# Patient Record
Sex: Female | Born: 1950 | Race: White | Hispanic: No | Smoking: Never smoker
Health system: Southern US, Community
[De-identification: ages and names within clinical notes are randomized; demographics above are authoritative.]

## PROBLEM LIST (undated history)

## (undated) DIAGNOSIS — G4730 Sleep apnea, unspecified: Secondary | ICD-10-CM

## (undated) DIAGNOSIS — I10 Essential (primary) hypertension: Secondary | ICD-10-CM

## (undated) DIAGNOSIS — E119 Type 2 diabetes mellitus without complications: Secondary | ICD-10-CM

## (undated) DIAGNOSIS — J449 Chronic obstructive pulmonary disease, unspecified: Secondary | ICD-10-CM

## (undated) DIAGNOSIS — B009 Herpesviral infection, unspecified: Secondary | ICD-10-CM

## (undated) HISTORY — PX: GALLBLADDER SURGERY: SHX652

## (undated) HISTORY — PX: HERNIA REPAIR: SHX51

## (undated) HISTORY — PX: BREAST EXCISIONAL BIOPSY: SUR124

## (undated) HISTORY — DX: Essential (primary) hypertension: I10

## (undated) HISTORY — PX: ABDOMINAL HYSTERECTOMY: SHX81

## (undated) HISTORY — DX: Herpesviral infection, unspecified: B00.9

## (undated) HISTORY — DX: Type 2 diabetes mellitus without complications: E11.9

---

## 2003-08-27 ENCOUNTER — Other Ambulatory Visit: Payer: Self-pay

## 2004-07-30 ENCOUNTER — Ambulatory Visit: Payer: Self-pay | Admitting: General Practice

## 2004-08-12 ENCOUNTER — Ambulatory Visit: Payer: Self-pay | Admitting: General Practice

## 2004-09-26 ENCOUNTER — Ambulatory Visit: Payer: Self-pay | Admitting: Surgery

## 2006-08-11 ENCOUNTER — Ambulatory Visit: Payer: Self-pay | Admitting: Endocrinology

## 2006-09-03 ENCOUNTER — Ambulatory Visit: Payer: Self-pay | Admitting: Internal Medicine

## 2006-10-05 ENCOUNTER — Ambulatory Visit: Payer: Self-pay | Admitting: Otolaryngology

## 2007-01-04 ENCOUNTER — Observation Stay: Payer: Self-pay | Admitting: Specialist

## 2007-01-04 ENCOUNTER — Other Ambulatory Visit: Payer: Self-pay

## 2007-07-15 ENCOUNTER — Ambulatory Visit: Payer: Self-pay | Admitting: Family Medicine

## 2008-02-28 ENCOUNTER — Ambulatory Visit: Payer: Self-pay | Admitting: Family Medicine

## 2008-10-29 ENCOUNTER — Ambulatory Visit: Payer: Self-pay | Admitting: Vascular Surgery

## 2009-10-03 ENCOUNTER — Ambulatory Visit: Payer: Self-pay | Admitting: Family Medicine

## 2010-10-29 ENCOUNTER — Ambulatory Visit: Payer: Self-pay | Admitting: Family Medicine

## 2011-11-02 ENCOUNTER — Ambulatory Visit: Payer: Self-pay | Admitting: Family Medicine

## 2012-01-08 ENCOUNTER — Ambulatory Visit: Payer: Self-pay | Admitting: Gastroenterology

## 2012-01-11 ENCOUNTER — Ambulatory Visit: Payer: Self-pay | Admitting: Specialist

## 2015-08-12 ENCOUNTER — Encounter: Payer: Self-pay | Admitting: *Deleted

## 2015-08-12 ENCOUNTER — Ambulatory Visit
Admission: RE | Admit: 2015-08-12 | Discharge: 2015-08-12 | Disposition: A | Discharge status: Home / Self Care | Payer: Self-pay | Source: Ambulatory Visit | Attending: Oncology | Admitting: Oncology

## 2015-08-12 ENCOUNTER — Ambulatory Visit: Payer: Self-pay | Attending: Oncology | Admitting: *Deleted

## 2015-08-12 VITALS — BP 131/75 | HR 60 | Temp 97.0°F | Ht 60.63 in | Wt 192.8 lb

## 2015-08-12 DIAGNOSIS — Z0000 Encounter for general adult medical examination without abnormal findings: Secondary | ICD-10-CM

## 2015-08-12 NOTE — Progress Notes (Signed)
Subjective:     Patient ID: Kimberly Bean, female   DOB: October 07, 1950, 65 y.o.   MRN: [id number]  HPI   Review of Systems     Objective:   Physical Exam  Pulmonary/Chest: Right breast exhibits no inverted nipple, no mass, no nipple discharge, no skin change and no tenderness. Left breast exhibits no inverted nipple, no mass, no nipple discharge, no skin change and no tenderness. Breasts are asymmetrical.  Right breast slightly larger than the left       Assessment:     65 year old White female referred to [facility] by Dr. Kary Kos for clinical breast exam and mammogram.  Clinical breast exam unremarkable.  Patient states she gets "a dot of white" on her nipples, which she then squeezes with some white discharge.  States this happens about once every 3-4 months for the last 10 years.  No wet discharge noted over the last 10 years or on todays exam. Taught self breast awareness.  Patient has been screened for eligibility.  She does not have any insurance, Medicare or Medicaid.  She also meets financial eligibility.  Hand-out given on the Affordable Care Act.     Plan:     Screening mammogram ordered.  Will follow up per [facility] protocol.  Patient is to call if she notices a wet nipple discharge or the amount, consistancy changes.  She is agreeable.

## 2015-08-12 NOTE — Patient Instructions (Signed)
Gave patient hand-out, Women Staying Healthy, Active and Well from [facility], with education on breast health, pap smears, heart and colon health. 

## 2015-08-23 ENCOUNTER — Encounter: Payer: Self-pay | Admitting: *Deleted

## 2015-08-23 NOTE — Progress Notes (Signed)
Letter mailed from the [facility] to inform patient of her normal mammogram results.  Patient is to follow-up with annual screening in one year.  HSIS to Christy. 

## 2016-08-28 ENCOUNTER — Other Ambulatory Visit: Payer: Self-pay | Admitting: Family Medicine

## 2016-08-28 DIAGNOSIS — Z1231 Encounter for screening mammogram for malignant neoplasm of breast: Secondary | ICD-10-CM

## 2016-09-16 ENCOUNTER — Ambulatory Visit
Admission: RE | Admit: 2016-09-16 | Discharge: 2016-09-16 | Disposition: A | Discharge status: Home / Self Care | Payer: Medicare HMO | Source: Ambulatory Visit | Attending: Family Medicine | Admitting: Family Medicine

## 2016-09-16 DIAGNOSIS — Z1231 Encounter for screening mammogram for malignant neoplasm of breast: Secondary | ICD-10-CM | POA: Diagnosis present

## 2017-08-18 ENCOUNTER — Other Ambulatory Visit: Payer: Self-pay | Admitting: Family Medicine

## 2017-08-18 DIAGNOSIS — Z1231 Encounter for screening mammogram for malignant neoplasm of breast: Secondary | ICD-10-CM

## 2017-09-17 ENCOUNTER — Ambulatory Visit
Admission: RE | Admit: 2017-09-17 | Discharge: 2017-09-17 | Disposition: A | Discharge status: Home / Self Care | Payer: Medicare HMO | Source: Ambulatory Visit | Attending: Family Medicine | Admitting: Family Medicine

## 2017-09-17 DIAGNOSIS — Z1231 Encounter for screening mammogram for malignant neoplasm of breast: Secondary | ICD-10-CM | POA: Diagnosis not present

## 2018-03-31 ENCOUNTER — Other Ambulatory Visit: Payer: Self-pay

## 2018-03-31 ENCOUNTER — Encounter: Payer: Self-pay | Admitting: Emergency Medicine

## 2018-03-31 ENCOUNTER — Emergency Department
Admission: EM | Admit: 2018-03-31 | Discharge: 2018-03-31 | Disposition: A | Discharge status: Home / Self Care | Payer: Medicare HMO | Attending: Emergency Medicine | Admitting: Emergency Medicine

## 2018-03-31 DIAGNOSIS — M545 Low back pain, unspecified: Secondary | ICD-10-CM

## 2018-03-31 MED ORDER — CYCLOBENZAPRINE HCL 5 MG PO TABS: 5.0000 mg | ORAL_TABLET | Freq: Three times a day (TID) | ORAL | 0 refills | Status: DC | PRN

## 2018-03-31 MED ORDER — KETOROLAC TROMETHAMINE 30 MG/ML IJ SOLN
30.0000 mg | Freq: Once | INTRAMUSCULAR | Status: AC
  Administered 2018-03-31: 30 mg via INTRAMUSCULAR
  Filled 2018-03-31: qty 1

## 2018-03-31 MED ORDER — LIDOCAINE 5 % EX PTCH
1.0000 | MEDICATED_PATCH | CUTANEOUS | Status: DC
  Administered 2018-03-31: 1 via TRANSDERMAL
  Filled 2018-03-31 (×2): qty 1

## 2018-03-31 MED ORDER — LIDOCAINE 5 % EX PTCH: 1.0000 | MEDICATED_PATCH | Freq: Two times a day (BID) | CUTANEOUS | 0 refills | Status: AC

## 2018-03-31 MED ORDER — CYCLOBENZAPRINE HCL 10 MG PO TABS
5.0000 mg | ORAL_TABLET | Freq: Once | ORAL | Status: AC
  Administered 2018-03-31: 5 mg via ORAL
  Filled 2018-03-31: qty 1

## 2018-03-31 MED ORDER — ACETAMINOPHEN 500 MG PO TABS
1000.0000 mg | ORAL_TABLET | Freq: Once | ORAL | Status: AC
  Administered 2018-03-31: 1000 mg via ORAL
  Filled 2018-03-31: qty 2

## 2018-03-31 NOTE — ED Notes (Signed)
Pt has lower back pain.    Pt reports twisting back 4 times during the past week.  Pt did not fall.   Pt denies urinary sx.  Pt alert.  Speech clear

## 2018-03-31 NOTE — ED Provider Notes (Signed)
[facility] Emergency Department Provider Note  ____________________________________________   First MD Initiated Contact with Patient 03/31/18 [phone]     (approximate)  I have reviewed the triage vital signs and the nursing notes.   HISTORY  Chief Complaint Back Pain    HPI Kimberly Bean is a 68 y.o. female with medical history as listed below who presents for evaluation of pain in her left lower back.  She states that she "twisted it" about a week ago when she was picking up something heavy in a store.  It has been bothering her intermittently but became worse tonight when she was trying to lift up her leg to climb into bed.  The pain is severe and she has trouble moving because moving around makes the pain worse.  If she bends over in a certain position the pain is relieved.  She denies any urinary symptoms including dysuria, difficulty urinating, urinary incontinence, and hematuria.  She has no history of kidney stones.  The pain is specifically on the left side of her lower back and is nonradiating.  She has no numbness or weakness in any of her extremities.  She had a similar issue a couple of years ago which eventually resolved on its own.  History reviewed. No pertinent past medical history.  There are no active problems to display for this patient.   Past Surgical History:  Procedure Laterality Date  . ABDOMINAL HYSTERECTOMY    . BREAST EXCISIONAL BIOPSY Right 2000s   neg    Prior to Admission medications   Medication Sig Start Date End Date Taking? Authorizing Provider  cyclobenzaprine (FLEXERIL) 5 MG tablet Take 1 tablet (5 mg total) by mouth 3 (three) times daily as needed for muscle spasms (back pain). 03/31/18   Hinda Kehr, MD  lidocaine (LIDODERM) 5 % Place 1 patch onto the skin every 12 (twelve) hours. Remove & Discard patch within 12 hours or as directed by MD.  Pers[place] Proud the patch off for 12 hours before applying a new one. 03/31/18 03/31/19   Hinda Kehr, MD    Allergies Penicillins  Family History  Problem Relation Age of Onset  . Breast cancer Neg Hx     Social History Social History   Tobacco Use  . Smoking status: Not on file  Substance Use Topics  . Alcohol use: Not on file  . Drug use: Not on file    Review of Systems Constitutional: No fever/chills Eyes: No visual changes. ENT: No sore throat. Cardiovascular: Denies chest pain. Respiratory: Denies shortness of breath. Gastrointestinal: No abdominal pain.  No nausea, no vomiting.  No diarrhea.  No constipation. Genitourinary: No urinary retention, incontinence, hematuria, nor dysuria. Musculoskeletal: Back pain as described above. Integumentary: Negative for rash. Neurological: Negative for headaches, focal weakness or numbness.   ____________________________________________   PHYSICAL EXAM:  VITAL SIGNS: ED Triage Vitals  Enc Vitals Group     BP 03/31/18 0030 (!) 143/81     Pulse Rate 03/31/18 0030 74     Resp 03/31/18 0030 16     Temp 03/31/18 0030 98 F (36.7 C)     Temp Source 03/31/18 0030 Oral     SpO2 03/31/18 0030 (!) 88 %     Weight 03/31/18 0022 87.5 kg (193 lb)     Height 03/31/18 0022 1.524 m (5')     Head Circumference --      Peak Flow --      Pain Score 03/31/18 0022 10  Pain Loc --      Pain Edu? --      Excl. in [place]? --     Constitutional: Alert and oriented.  Appears uncomfortable but nontoxic. Eyes: Conjunctivae are normal.  Head: Atraumatic. Cardiovascular: Normal rate, regular rhythm. Good peripheral circulation. Respiratory: Normal respiratory effort.   Musculoskeletal: Tenderness to palpation of the left side of her lower back including her flank.  Pain is reproduced with moving around but the patient cannot find certain positions of comfort.  No indurated or fluctuant lesions to suggest an infection. Neurologic:  Normal speech and language. No gross focal neurologic deficits are appreciated.  Skin:  Skin  is warm, dry and intact. No rash noted. Psychiatric: Mood and affect are normal. Speech and behavior are normal.  ____________________________________________   LABS (all labs ordered are listed, but only abnormal results are displayed)  Labs Reviewed - No data to display ____________________________________________  EKG  No indication for EKG ____________________________________________  RADIOLOGY   ED MD interpretation: No indication for imaging  Official radiology report(s): No results found.  ____________________________________________   PROCEDURES  Critical Care performed: No   Procedure(s) performed:   Procedures   ____________________________________________   INITIAL IMPRESSION / ASSESSMENT AND PLAN / ED COURSE  As part of my medical decision making, I reviewed the following data within the [place] notes reviewed and incorporated, Old chart reviewed, Notes from prior ED visits and [place] Controlled Substance Database    Presentation is most consistent with musculoskeletal back pain/strain.  I did discuss the possibility of kidney stones with the patient but given that she can find a position of comfort and has had no other urinary symptoms she is declining the CT scan at this time and I think that is appropriate.  Is much more likely to be a muscle strain.  Although she is 27 with multiple chronic medical conditions and I must consider the beers criteria, I think she would benefit from cyclobenzaprine, Lidoderm patch, Tylenol, and NSAIDs.  I reviewed her medication list and she is taking omeprazole which should be protective of her stomach when she takes the ibuprofen which I am recommending as an outpatient.  I am giving her Toradol 30 mg intramuscular here as well as cyclobenzaprine 5 mg by mouth and Tylenol 1000 mg by mouth.  I wrote prescriptions as described below and recommended that she follow-up with her primary care doctor at the  next available opportunity.  I gave my usual and customary return precautions.  She and her family understand and agree with the plan.     ____________________________________________  FINAL CLINICAL IMPRESSION(S) / ED DIAGNOSES  Final diagnoses:  Acute left-sided low back pain without sciatica     MEDICATIONS GIVEN DURING THIS VISIT:  Medications  lidocaine (LIDODERM) 5 % 1 patch (has no administration in time range)  acetaminophen (TYLENOL) tablet 1,000 mg (1,000 mg Oral Given 03/31/18 0146)  cyclobenzaprine (FLEXERIL) tablet 5 mg (5 mg Oral Given 03/31/18 0146)  ketorolac (TORADOL) 30 MG/ML injection 30 mg (30 mg Intramuscular Given 03/31/18 0147)     ED Discharge Orders         Ordered    lidocaine (LIDODERM) 5 %  Every 12 hours     03/31/18 0143    cyclobenzaprine (FLEXERIL) 5 MG tablet  3 times daily PRN     03/31/18 0143           Note:  This document was prepared using Dragon voice recognition software  and may include unintentional dictation errors.    Hinda Kehr, MD 03/31/18 [phone]

## 2018-03-31 NOTE — Discharge Instructions (Signed)
You have been seen in the Emergency Department (ED)  today for back pain.  Your workup and exam have not shown any acute abnormalities and you are likely suffering from muscle strain or possible problems with your discs, but there is no treatment that will fix your symptoms at this time.  Please take Motrin (ibuprofen) as needed for your pain according to the instructions written on the box.  Alternatively, for the next five days you can take 600mg  three times daily with meals (it may upset your stomach).  We also recommend you take Tylenol (acetaminophen) 650 mg by mouth every 6 hours as needed.  Use the prescribed muscle relaxer, but only if you are still having acute pain (do not take it any more than necessary).  Remember that it can make you sleepy, so try to only take it in the evening, and do not drive after taking the medication.  Use the prescribed Lidoderm patches according to the prescription instructions.  Please follow up with your doctor as soon as possible regarding today's ED visit and your back pain.  Return to the ED for worsening back pain, fever, weakness or numbness of either leg, or if you develop either (1) an inability to urinate or have bowel movements, or (2) loss of your ability to control your bathroom functions (if you start having "accidents"), or if you develop other new symptoms that concern you.

## 2018-03-31 NOTE — ED Triage Notes (Signed)
Pt to triage via w/c with no distress noted; reports last wk "twisted" her back; c/o persistent pain to left lower back, nonradiating with no accomp symptoms; st hx of same last yr

## 2018-08-29 ENCOUNTER — Other Ambulatory Visit: Payer: Self-pay | Admitting: Family Medicine

## 2018-08-29 DIAGNOSIS — Z1231 Encounter for screening mammogram for malignant neoplasm of breast: Secondary | ICD-10-CM

## 2018-10-06 ENCOUNTER — Ambulatory Visit
Admission: RE | Admit: 2018-10-06 | Discharge: 2018-10-06 | Disposition: A | Discharge status: Home / Self Care | Payer: Medicare HMO | Source: Ambulatory Visit | Attending: Family Medicine | Admitting: Family Medicine

## 2018-10-06 DIAGNOSIS — Z1231 Encounter for screening mammogram for malignant neoplasm of breast: Secondary | ICD-10-CM | POA: Diagnosis present

## 2019-03-08 ENCOUNTER — Ambulatory Visit: Payer: Medicare HMO | Attending: Internal Medicine

## 2019-03-08 DIAGNOSIS — Z20822 Contact with and (suspected) exposure to covid-19: Secondary | ICD-10-CM

## 2019-03-10 LAB — NOVEL CORONAVIRUS, NAA: SARS-CoV-2, NAA: NOT DETECTED

## 2019-03-13 ENCOUNTER — Telehealth: Payer: Self-pay | Admitting: General Practice

## 2019-03-13 NOTE — Telephone Encounter (Signed)
Negative COVID results given. Patient results "NOT Detected." Caller expressed understanding. ° °

## 2019-05-11 DIAGNOSIS — Z6841 Body Mass Index (BMI) 40.0 and over, adult: Secondary | ICD-10-CM | POA: Insufficient documentation

## 2019-09-12 ENCOUNTER — Other Ambulatory Visit: Payer: Self-pay | Admitting: Family Medicine

## 2019-09-12 DIAGNOSIS — Z1231 Encounter for screening mammogram for malignant neoplasm of breast: Secondary | ICD-10-CM

## 2019-10-09 ENCOUNTER — Other Ambulatory Visit: Payer: Self-pay

## 2019-10-09 ENCOUNTER — Ambulatory Visit
Admission: RE | Admit: 2019-10-09 | Discharge: 2019-10-09 | Disposition: A | Discharge status: Home / Self Care | Payer: Medicare HMO | Source: Ambulatory Visit | Attending: Family Medicine | Admitting: Family Medicine

## 2019-10-09 DIAGNOSIS — Z1231 Encounter for screening mammogram for malignant neoplasm of breast: Secondary | ICD-10-CM | POA: Insufficient documentation

## 2019-12-22 ENCOUNTER — Other Ambulatory Visit: Payer: Self-pay

## 2019-12-22 DIAGNOSIS — N9089 Other specified noninflammatory disorders of vulva and perineum: Secondary | ICD-10-CM

## 2019-12-22 NOTE — Progress Notes (Signed)
Received message from Kimberly Bean to refer for concern for vulvar cancer. Biopsied area 12/22/19. Referral entered. Called and spoke with Kimberly Bean. Appointment scheduled for 10/20 at 0830 with Kimberly Bean. Directions to the cancer center given.

## 2019-12-26 NOTE — Progress Notes (Signed)
Patient states she is having pain with lesions. Painful to sit down.

## 2019-12-27 ENCOUNTER — Other Ambulatory Visit: Payer: Self-pay

## 2019-12-27 ENCOUNTER — Inpatient Hospital Stay: Payer: Medicare HMO | Attending: Obstetrics and Gynecology | Admitting: Obstetrics and Gynecology

## 2019-12-27 DIAGNOSIS — I10 Essential (primary) hypertension: Secondary | ICD-10-CM | POA: Diagnosis not present

## 2019-12-27 DIAGNOSIS — Z6838 Body mass index (BMI) 38.0-38.9, adult: Secondary | ICD-10-CM | POA: Diagnosis not present

## 2019-12-27 DIAGNOSIS — D398 Neoplasm of uncertain behavior of other specified female genital organs: Secondary | ICD-10-CM

## 2019-12-27 DIAGNOSIS — G4710 Hypersomnia, unspecified: Secondary | ICD-10-CM | POA: Insufficient documentation

## 2019-12-27 DIAGNOSIS — E119 Type 2 diabetes mellitus without complications: Secondary | ICD-10-CM | POA: Diagnosis not present

## 2019-12-27 DIAGNOSIS — Z90710 Acquired absence of both cervix and uterus: Secondary | ICD-10-CM | POA: Insufficient documentation

## 2019-12-27 DIAGNOSIS — E669 Obesity, unspecified: Secondary | ICD-10-CM | POA: Insufficient documentation

## 2019-12-27 DIAGNOSIS — J439 Emphysema, unspecified: Secondary | ICD-10-CM

## 2019-12-27 DIAGNOSIS — R519 Headache, unspecified: Secondary | ICD-10-CM | POA: Insufficient documentation

## 2019-12-27 DIAGNOSIS — N9089 Other specified noninflammatory disorders of vulva and perineum: Secondary | ICD-10-CM

## 2019-12-27 DIAGNOSIS — I2510 Atherosclerotic heart disease of native coronary artery without angina pectoris: Secondary | ICD-10-CM | POA: Insufficient documentation

## 2019-12-27 DIAGNOSIS — K219 Gastro-esophageal reflux disease without esophagitis: Secondary | ICD-10-CM | POA: Insufficient documentation

## 2019-12-27 DIAGNOSIS — Z7982 Long term (current) use of aspirin: Secondary | ICD-10-CM | POA: Insufficient documentation

## 2019-12-27 DIAGNOSIS — C519 Malignant neoplasm of vulva, unspecified: Secondary | ICD-10-CM | POA: Insufficient documentation

## 2019-12-27 DIAGNOSIS — Z79899 Other long term (current) drug therapy: Secondary | ICD-10-CM | POA: Diagnosis not present

## 2019-12-27 DIAGNOSIS — Z7984 Long term (current) use of oral hypoglycemic drugs: Secondary | ICD-10-CM | POA: Diagnosis not present

## 2019-12-27 DIAGNOSIS — E785 Hyperlipidemia, unspecified: Secondary | ICD-10-CM | POA: Insufficient documentation

## 2019-12-27 DIAGNOSIS — M629 Disorder of muscle, unspecified: Secondary | ICD-10-CM | POA: Insufficient documentation

## 2019-12-27 DIAGNOSIS — R51 Headache: Secondary | ICD-10-CM | POA: Insufficient documentation

## 2019-12-27 MED ORDER — HYDROCODONE-ACETAMINOPHEN 5-325 MG PO TABS: 1.0000 | ORAL_TABLET | Freq: Three times a day (TID) | ORAL | 0 refills | Status: DC | PRN

## 2019-12-27 NOTE — Patient Instructions (Signed)
You have an appointment scheduled with Dr. Mellody Drown at the [facility], Friday, October 22nd, at 8:30 am. The address is [place]. [place]. Clinic [facility].

## 2019-12-27 NOTE — Progress Notes (Signed)
Gynecologic Oncology Consult Visit   Referring Provider: Dr Leonides Schanz  Chief Concern: vulvar lesion  Subjective:  Kimberly Bean is a 69 y.o. P3 female who is seen in consultation from Dr. Leonides Schanz for vulvar lesion.  Seen by Dr Leonides Schanz 12/22/19 in consultation from Dr Kary Kos.  She reports a vulvar lesion that has been ongoing since April this year. She has tried multiple things to help get rid of the lesion but nothing has worked. Intermittently has bleeding from the site.  Dr Leonides Schanz reports a clitoral mass that is about 2cm x 2cm, ulcerated and white plaques present. Mass noted behind clitoral lesion extending internally and towards the right labia by approximately 2cm.  Dr Leonides Schanz did periclitoral biopsy and results still pending,  Using non narcotic pain medication.  Still has a lot of pain.  Patient has had a hysterectomy. 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 Diabetes mellitus without complication ([place])   . Hypertension     Past Surgical History: Past Surgical History:  Procedure Laterality Date  . ABDOMINAL HYSTERECTOMY    . BREAST EXCISIONAL BIOPSY Right 2000s   neg  . GALLBLADDER SURGERY    . HERNIA REPAIR     Immunization History  Administered Date(s) Administered  . PFIZER SARS-COV-2 Vaccination 04/14/2019, 04/24/2019, 05/15/2019, 05/15/2019  . Pneumococcal Conjugate-13 10/26/2017, 11/07/2017  . Pneumococcal Polysaccharide-23 11/08/2018  . Zoster Recombinat (Shingrix) 10/26/2017, 11/07/2017, 12/27/2017     Family History: Family History  Problem Relation Age of Onset  . Breast cancer Neg Hx     Social History: She is divorced. Currently retired.  Social History   Socioeconomic History  . Marital status: Married    Spouse name: Not on file  . Number of children: Not on file  . Years of education: Not on file  . Highest  education level: Not on file  Occupational History  . Not on file  Tobacco Use  . Smoking status: Never Smoker  . Smokeless tobacco: Never Used  Substance and Sexual Activity  . Alcohol use: Not Currently  . Drug use: Never  . Sexual activity: Not on file  Other Topics Concern  . Not on file  Social History Narrative  . Not on file   Social Determinants of Health   Financial Resource Strain:   . Difficulty of Paying Living Expenses: Not on file  Food Insecurity:   . Worried About Charity fundraiser in the Last Year: Not on file  . Ran Out of Food in the Last Year: Not on file  Transportation Needs:   . Lack of Transportation (Medical): Not on file  . Lack of Transportation (Non-Medical): Not on file  Physical Activity:   . Days of Exercise per Week: Not on file  . Minutes of Exercise per Session: Not on file  Stress:   . Feeling of Stress : Not on file  Social Connections:   . Frequency of Communication with Friends and Family: Not on file  . Frequency of Social Gatherings with Friends and Family: Not on file  . Attends Religious Services: Not on file  . Active Member of Clubs or Organizations: Not on file  . Attends Archivist Meetings: Not on file  . Marital Status: Not on file  Intimate Partner Violence:   . Fear of Current or Ex-Partner:  Not on file  . Emotionally Abused: Not on file  . Physically Abused: Not on file  . Sexually Abused: Not on file    Allergies: Allergies  Allergen Reactions  . Cephalexin Other (See Comments)  . Penicillins Rash    Current Medications: Current Outpatient Medications  Medication Sig Dispense Refill  . ADVAIR DISKUS 250-50 MCG/DOSE AEPB SMARTSIG:1 Puff(s) Via Inhaler Every 12 Hours    . alendronate (FOSAMAX) 70 MG tablet TAKE 1 TABLET BY MOUTH EVERY 7 DAYS WITH A FULL GLASS OF WATER. DO NOT LIE DOWN FOR THE NEXT 30 MINUTES    . aspirin 325 MG tablet Take by mouth.    . Calcium Carbonate-Vitamin D3 600-400 MG-UNIT  TABS Take by mouth.    . Fluticasone-Salmeterol (ADVAIR) 250-50 MCG/DOSE AEPB Inhale into the lungs.    Marland Kitchen gemfibrozil (LOPID) 600 MG tablet Take 600 mg by mouth 2 (two) times daily.    . metFORMIN (GLUCOPHAGE) 1000 MG tablet Take 1,000 mg by mouth 2 (two) times daily.    . metoprolol tartrate (LOPRESSOR) 25 MG tablet Take 1 tablet by mouth 2 (two) times daily.    . nateglinide (STARLIX) 120 MG tablet Take 120 mg by mouth 3 (three) times daily.    Marland Kitchen omega-3 fish oil (MAXEPA) 1000 MG CAPS capsule Take by mouth.    . pravastatin (PRAVACHOL) 20 MG tablet Take 20 mg by mouth daily.    Marland Kitchen rOPINIRole (REQUIP) 0.25 MG tablet TAKE 1 TABLET BY MOUTH EVERY NIGHT AT NIGHT, MAY TITRATE BY 1 TABLET EVERY 2 TO 3 DAYS AS NEEDED.    . ciprofloxacin (CIPRO) 250 MG tablet Take 250 mg by mouth 2 (two) times daily. (Patient not taking: Reported on 12/26/2019)    . cyclobenzaprine (FLEXERIL) 5 MG tablet Take 1 tablet (5 mg total) by mouth 3 (three) times daily as needed for muscle spasms (back pain). (Patient not taking: Reported on 12/26/2019) 30 tablet 0  . HYDROcodone-acetaminophen (NORCO) 5-325 MG tablet Take 1 tablet by mouth every 8 (eight) hours as needed for moderate pain or severe pain. 30 tablet 0  . omeprazole (PRILOSEC) 20 MG capsule Take 20 mg by mouth daily. (Patient not taking: Reported on 12/26/2019)     No current facility-administered medications for this visit.    Review of Systems General: negative for, fevers, chills, fatigue, changes in sleep, changes in weight or appetite Skin: negative for changes in color, texture, moles or lesions Eyes: negative for, changes in vision, pain, diplopia HEENT: some tinnitus and hearing loss Breasts: negative for breast lumps Pulmonary: mild dyspnea on exertion Cardiac: negative for, palpitations, syncope, pain, discomfort, pressure Gastrointestinal: negative for, dysphagia, nausea, vomiting, jaundice, pain, constipation, diarrhea, hematemesis,  hematochezia Musculoskeletal: negative for, pain, stiffness, swelling, range of motion limitation Hematology: negative for, easy bruising, bleeding Neurologic/Psych: negative for, headaches, seizures, paralysis, weakness, tremor, change in gait, change in sensation, mood swings, depression, anxiety, change in memory  Objective:  Physical Examination:  BP (!) 139/55   Pulse 60   Temp 98 F (36.7 C)   Resp 20   Wt 196 lb 14.4 oz (89.3 kg)   LMP 08/12/2003 (Approximate)   SpO2 90%   BMI 38.45 kg/m    ECOG Performance Status: 1 - Symptomatic but completely ambulatory  General appearance: alert, cooperative and appears stated age HEENT:PERRLA and extra ocular movement intact Lymph node survey: non-palpable, axillary, inguinal, supraclavicular Cardiovascular: regular rate and rhythm, no murmurs or gallops Respiratory: normal air entry, lungs clear to auscultation  and no rales, rhonchi or wheezing Breast exam: not examined. Abdomen: soft, non-tender, without masses or organomegaly, no hernias and well healed incision Back: inspection of back is normal Extremities: extremities normal, atraumatic, no cyanosis or edema Skin exam - normal coloration and turgor, no rashes, no suspicious skin lesions noted. Neurological exam reveals alert, oriented, normal speech, no focal findings or movement disorder noted.  Pelvic: exam chaperoned by nurse;  Vulva: normal appearing vulva with no masses, tenderness or lesions 4 cm fungating lesion from 9-12 o'clock that involves the clitoris ; Vagina: normal vagina; Adnexa: normal adnexa in size, nontender and no masses.  Rectal normal.  Lab Review  none    Assessment:  Kimberly Bean is a 69 y.o. 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 Biopsy from 10/15 pending.    Medical co-morbidities complicating care: HTN, diabetes and obesity.  Plan:   Problem List Items Addressed This Visit      Respiratory    Emphysema of lung ([place])   Relevant Medications   ADVAIR DISKUS 250-50 MCG/DOSE AEPB   Fluticasone-Salmeterol (ADVAIR) 250-50 MCG/DOSE AEPB     Genitourinary   Vulvar cancer (H[facility])   Relevant Medications   ciprofloxacin (CIPRO) 250 MG tablet   aspirin 325 MG tablet      We discussed options for management including radical vulvectomy and bilateral inguinofemoral lymph node dissection.  The lesion is too large for a sentinel node approach, although we may inject methylene blue to aid in the dissection.  Will give her oxycodone 5 mg q6h prn for pain and also lidocaine gel.    We will see her in my [place] clinic later this week for pre op visit.   The patient's diagnosis, an outline of the further diagnostic and laboratory studies which will be required, the recommendation, and alternatives were discussed.  All questions were answered to the patient's satisfaction.  A total of 60 minutes were spent with the patient/family today; 60% was spent in education, counseling and coordination of care for vulvar cancer.    Verlon Au, NP   CC:  [place] Pink, MD [address] [place],  [place] [address] [phone]

## 2020-01-01 LAB — IGP, APTIMA HPV: HPV Aptima: NEGATIVE

## 2020-01-02 ENCOUNTER — Telehealth: Payer: Self-pay

## 2020-01-02 NOTE — Telephone Encounter (Signed)
Called and notified Kimberly Bean with negative pap smear/hpv results.

## 2020-01-03 ENCOUNTER — Ambulatory Visit: Payer: Medicare HMO

## 2020-01-19 ENCOUNTER — Telehealth: Payer: Self-pay

## 2020-01-19 NOTE — Telephone Encounter (Signed)
Called and spoke with Kimberly Bean. Post operative wound check arranged for 01/31/20 at 1130 with Dr. Fransisca Connors. Pathology pending at this time.

## 2020-01-31 ENCOUNTER — Encounter (INDEPENDENT_AMBULATORY_CARE_PROVIDER_SITE_OTHER): Payer: Self-pay

## 2020-01-31 ENCOUNTER — Other Ambulatory Visit: Payer: Self-pay

## 2020-01-31 ENCOUNTER — Inpatient Hospital Stay: Payer: Medicare HMO | Attending: Obstetrics and Gynecology | Admitting: Nurse Practitioner

## 2020-01-31 VITALS — BP 137/61 | HR 76 | Temp 97.8°F | Resp 20 | Wt 190.2 lb

## 2020-01-31 DIAGNOSIS — Z9079 Acquired absence of other genital organ(s): Secondary | ICD-10-CM | POA: Diagnosis not present

## 2020-01-31 DIAGNOSIS — C519 Malignant neoplasm of vulva, unspecified: Secondary | ICD-10-CM

## 2020-01-31 NOTE — Progress Notes (Signed)
Gynecologic Oncology Interval Visit   Referring Provider: Dr Leonides Schanz  Chief Concern: vulvar lesion  Subjective:  Kimberly Bean is a 69 y.o. 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   She underwent radical vulvectomy on 01/15/20 with Dr. Fransisca Connors. Final path: at least 1B scc of vulva, tumor size 2.9 cm, DOI 11 mm, negative margin, closest margin 6 mm.   Case discussed at [facility] and surveillance was recommended. She continues lovenox. Some pain and bruising at injection site but otherwise is feeling well. Requesting pain medication.    Gynecologic Oncology History:  Seen by Dr Leonides Schanz 12/22/19 in consultation from Dr Kary Kos.  She reports a vulvar lesion that has been ongoing since April this year. She has tried multiple things to help get rid of the lesion but nothing has worked. Intermittently has bleeding from the site.  Dr Leonides Schanz reports a clitoral mass that is about 2cm x 2cm, ulcerated and white plaques present. Mass noted behind clitoral lesion extending internally and towards the right labia by approximately 2cm.  Dr Leonides Schanz did periclitoral biopsy and results still pending,  Using non narcotic pain medication.  Still has a lot of pain.  Patient has had a hysterectomy. 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 Diabetes mellitus without complication ([place])   . Herpes    Last outbreak 1980  . Hypertension     Past Surgical History: Past Surgical History:  Procedure Laterality Date  . ABDOMINAL HYSTERECTOMY    . BREAST EXCISIONAL BIOPSY Right 2000s   neg  . GALLBLADDER SURGERY    . HERNIA REPAIR     Immunization History  Administered Date(s) Administered  . PFIZER SARS-COV-2 Vaccination 04/14/2019,  04/24/2019, 05/15/2019, 05/15/2019  . Pneumococcal Conjugate-13 10/26/2017, 11/07/2017  . Pneumococcal Polysaccharide-23 11/08/2018  . Zoster Recombinat (Shingrix) 10/26/2017, 11/07/2017, 12/27/2017     Family History: Family History  Problem Relation Age of Onset  . Breast cancer Neg Hx     Social History: She is divorced. Currently retired.  Social History   Socioeconomic History  . Marital status: Married    Spouse name: Not on file  . Number of children: Not on file  . Years of education: Not on file  . Highest education level: Not on file  Occupational History  . Not on file  Tobacco Use  . Smoking status: Never Smoker  . Smokeless tobacco: Never Used  Substance and Sexual Activity  . Alcohol use: Not Currently  . Drug use: Never  . Sexual activity: Not on file  Other Topics Concern  . Not on file  Social History Narrative  . Not on file   Social Determinants of Health   Financial Resource Strain:   . Difficulty of Paying Living Expenses: Not on file  Food Insecurity:   . Worried About Charity fundraiser in the Last Year: Not on file  . Ran Out of Food in the Last Year: Not on file  Transportation Needs:   . Lack of Transportation (Medical): Not on file  . Lack of Transportation (Non-Medical): Not on file  Physical Activity:   . Days of Exercise per Week: Not on file  . Minutes of Exercise per Session: Not on  file  Stress:   . Feeling of Stress : Not on file  Social Connections:   . Frequency of Communication with Friends and Family: Not on file  . Frequency of Social Gatherings with Friends and Family: Not on file  . Attends Religious Services: Not on file  . Active Member of Clubs or Organizations: Not on file  . Attends Archivist Meetings: Not on file  . Marital Status: Not on file  Intimate Partner Violence:   . Fear of Current or Ex-Partner: Not on file  . Emotionally Abused: Not on file  . Physically Abused: Not on file  . Sexually  Abused: Not on file   Immunization History  Administered Date(s) Administered  . PFIZER SARS-COV-2 Vaccination 04/14/2019, 04/24/2019, 05/15/2019, 05/15/2019  . Pneumococcal Conjugate-13 10/26/2017, 11/07/2017  . Pneumococcal Polysaccharide-23 11/08/2018  . Zoster Recombinat (Shingrix) 10/26/2017, 11/07/2017, 12/27/2017    Allergies: Allergies  Allergen Reactions  . Cephalexin Other (See Comments)  . Penicillins Rash    Current Medications: Current Outpatient Medications  Medication Sig Dispense Refill  . ADVAIR DISKUS 250-50 MCG/DOSE AEPB SMARTSIG:1 Puff(s) Via Inhaler Every 12 Hours    . alendronate (FOSAMAX) 70 MG tablet TAKE 1 TABLET BY MOUTH EVERY 7 DAYS WITH A FULL GLASS OF WATER. DO NOT LIE DOWN FOR THE NEXT 30 MINUTES    . Calcium Carbonate-Vitamin D3 600-400 MG-UNIT TABS Take by mouth.    . enoxaparin (LOVENOX) 40 MG/0.4ML injection Inject into the skin.    Marland Kitchen Fluticasone-Salmeterol (ADVAIR) 250-50 MCG/DOSE AEPB Inhale into the lungs.    Marland Kitchen gemfibrozil (LOPID) 600 MG tablet Take 600 mg by mouth 2 (two) times daily.    Marland Kitchen HYDROcodone-acetaminophen (NORCO) 5-325 MG tablet Take 1 tablet by mouth every 8 (eight) hours as needed for moderate pain or severe pain. 30 tablet 0  . metFORMIN (GLUCOPHAGE) 1000 MG tablet Take 1,000 mg by mouth 2 (two) times daily.    . metoprolol tartrate (LOPRESSOR) 25 MG tablet Take 1 tablet by mouth 2 (two) times daily.    . nateglinide (STARLIX) 120 MG tablet Take 120 mg by mouth 3 (three) times daily.    Marland Kitchen omega-3 fish oil (MAXEPA) 1000 MG CAPS capsule Take by mouth.    Marland Kitchen omeprazole (PRILOSEC) 20 MG capsule Take 20 mg by mouth daily.     . pravastatin (PRAVACHOL) 20 MG tablet Take 20 mg by mouth daily.    Marland Kitchen rOPINIRole (REQUIP) 0.25 MG tablet TAKE 1 TABLET BY MOUTH EVERY NIGHT AT NIGHT, MAY TITRATE BY 1 TABLET EVERY 2 TO 3 DAYS AS NEEDED.    Marland Kitchen aspirin 325 MG tablet Take by mouth. (Patient not taking: Reported on 01/30/2020)    . ciprofloxacin (CIPRO)  250 MG tablet Take 250 mg by mouth 2 (two) times daily. (Patient not taking: Reported on 12/26/2019)    . cyclobenzaprine (FLEXERIL) 5 MG tablet Take 1 tablet (5 mg total) by mouth 3 (three) times daily as needed for muscle spasms (back pain). (Patient not taking: Reported on 12/26/2019) 30 tablet 0   No current facility-administered medications for this visit.    Review of Systems General:  no complaints Skin: no complaints Eyes: no complaints HEENT: no complaints Breasts: no complaints Pulmonary: no complaints Cardiac: no complaints Gastrointestinal: no complaints Genitourinary/Sexual: no complaints Ob/Gyn: per hpi Musculoskeletal: no complaints Hematology: no complaints Neurologic/Psych: no complaints   Objective:  Physical Examination:  BP 137/61   Pulse 76   Temp 97.8 F (36.6 C)   Resp  20   Wt 190 lb 3.2 oz (86.3 kg)   LMP 08/12/2003 (Approximate)   SpO2 (!) 88%   BMI 37.15 kg/m    ECOG Performance Status: 1 - Symptomatic but completely ambulatory  GENERAL: Patient is a well appearing female in no acute distress HEENT:  Sclera clear. Anicteric NODES:  Negative axillary, supraclavicular, inguinal lymph node survery LUNGS:  Clear to auscultation bilaterally.   HEART:  Regular rate and rhythm.  ABDOMEN:  Soft, nontender.  No hernias, incisions well healed. No masses or ascites EXTREMITIES:  No peripheral edema. Atraumatic. No cyanosis SKIN:  Clear with no obvious rashes or skin changes.  NEURO:  Nonfocal. Well oriented.  Appropriate affect.  Pelvic: External genitalia: surgical incision healing well. Suture material still present. No evidence of infection. Speculum, Internal, and Rectal exam: deferred.   Lab Review  none    Assessment:  JANEKA LIBMAN is a 69 y.o. female diagnosed with presumed at least stage IB vulvar cancer. Biopsy on 10/15 not diagnostic but suspicious for squamous cell carcinoma. Underwent radical vulvectomy with Dr. Fransisca Connors on 01/15/20.  Inguinal node dissection deferred due to inguinal candidal infection discovered at time of surgery. Final pathology with at least IB squamous cell carcinoma of the vulva. Tumor size 2.9 cm, depth of invasion 11 mm. Negative margin, closest margin 6 mm. Presents for wound check. Healing well suture material present. Continues lovenox.    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 Plan is for her to return to surgery at [facility] in next 1-2 weeks. Calton Dach, PA updated. Post op wound healing slowly but well. No evidence of secondary infection. Recommended she continue using nystatin, keep inguinal folds and groin clean and dry to present candida. Continue lovenox as prescribed. Sent prescription for Tylenol #3 for pain. Opioid precautions reviewed.   The patient's diagnosis, an outline of the further diagnostic and laboratory studies which will be required, the recommendation, and alternatives were discussed.  All questions were answered to the patient's satisfaction.   Verlon Au, NP   CC:  [place] Pink, MD [address] [place],  [place] [address] [phone]

## 2020-02-05 ENCOUNTER — Telehealth: Payer: Self-pay | Admitting: *Deleted

## 2020-02-05 MED ORDER — ACETAMINOPHEN-CODEINE #3 300-30 MG PO TABS
1.0000 | ORAL_TABLET | Freq: Four times a day (QID) | ORAL | 0 refills | Status: DC | PRN
Start: 2020-02-05 — End: 2021-09-17

## 2020-02-05 NOTE — Telephone Encounter (Signed)
sent 

## 2020-02-05 NOTE — Telephone Encounter (Signed)
Patient called reporting that she was to have had a pain medicine called in on the 24th when seen and it was not sent. She requests that this be done

## 2020-02-16 DIAGNOSIS — I739 Peripheral vascular disease, unspecified: Secondary | ICD-10-CM | POA: Insufficient documentation

## 2020-02-16 DIAGNOSIS — G2581 Restless legs syndrome: Secondary | ICD-10-CM | POA: Insufficient documentation

## 2020-06-12 ENCOUNTER — Other Ambulatory Visit: Payer: Self-pay

## 2020-06-12 ENCOUNTER — Inpatient Hospital Stay: Payer: Medicare HMO | Attending: Obstetrics and Gynecology | Admitting: Obstetrics and Gynecology

## 2020-06-12 VITALS — BP 160/73 | HR 68 | Temp 98.2°F | Resp 16 | Wt 191.5 lb

## 2020-06-12 DIAGNOSIS — Z7901 Long term (current) use of anticoagulants: Secondary | ICD-10-CM | POA: Insufficient documentation

## 2020-06-12 DIAGNOSIS — Z7982 Long term (current) use of aspirin: Secondary | ICD-10-CM | POA: Diagnosis not present

## 2020-06-12 DIAGNOSIS — I10 Essential (primary) hypertension: Secondary | ICD-10-CM | POA: Diagnosis not present

## 2020-06-12 DIAGNOSIS — C519 Malignant neoplasm of vulva, unspecified: Secondary | ICD-10-CM | POA: Diagnosis present

## 2020-06-12 DIAGNOSIS — E119 Type 2 diabetes mellitus without complications: Secondary | ICD-10-CM | POA: Insufficient documentation

## 2020-06-12 DIAGNOSIS — Z7984 Long term (current) use of oral hypoglycemic drugs: Secondary | ICD-10-CM | POA: Insufficient documentation

## 2020-06-12 DIAGNOSIS — Z79899 Other long term (current) drug therapy: Secondary | ICD-10-CM | POA: Insufficient documentation

## 2020-06-12 DIAGNOSIS — I252 Old myocardial infarction: Secondary | ICD-10-CM | POA: Diagnosis not present

## 2020-06-12 DIAGNOSIS — Z9079 Acquired absence of other genital organ(s): Secondary | ICD-10-CM | POA: Insufficient documentation

## 2020-06-12 DIAGNOSIS — E669 Obesity, unspecified: Secondary | ICD-10-CM | POA: Diagnosis not present

## 2020-06-12 NOTE — Progress Notes (Signed)
Gynecologic Oncology Interval Visit   Referring Provider: Dr Leonides Schanz  Chief Concern: vulvar cancer  Subjective:  Kimberly Bean is a 70 y.o. 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    No new complaints today.   Gynecologic Oncology History:  Seen by Dr Leonides Schanz 12/22/19 in consultation from Dr Kary Kos.  She reports a vulvar lesion that has been ongoing since April this year. She has tried multiple things to help get rid of the lesion but nothing has worked. Intermittently has bleeding from the site.  Dr Leonides Schanz reports a clitoral mass that is about 2cm x 2cm, ulcerated and white plaques present. Mass noted behind clitoral lesion extending internally and towards the right labia by approximately 2cm.  Dr Leonides Schanz did periclitoral biopsy and results still pending,  Patient has had a hysterectomy. 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 Fransisca Connors. Final path: at least 1B scc of vulva, tumor size 2.9 cm, DOI 11 mm, negative margin, closest margin 6 mm. Inguinal lymph nodes not removed due to severe yeast infection.   On 02/20/20 she underwent bilateral inguinal SLN mapping and biopsies with three negative nodes on the right and one negative node on the left.   Case discussed at [facility] and surveillance was recommended. She completed course of lovenox.    Past Medical History: Past Medical History:  Diagnosis Date  . Diabetes mellitus without complication ([place])   . Herpes    Last outbreak 1980  . Hypertension     Past Surgical History: Past Surgical History:  Procedure Laterality Date  . ABDOMINAL HYSTERECTOMY    . BREAST EXCISIONAL BIOPSY Right 2000s   neg  . GALLBLADDER SURGERY    . HERNIA REPAIR     Immunization History  Administered Date(s)  Administered  . PFIZER(Purple Top)SARS-COV-2 Vaccination 04/14/2019, 04/24/2019, 05/15/2019, 05/15/2019  . Pneumococcal Conjugate-13 10/26/2017, 11/07/2017  . Pneumococcal Polysaccharide-23 11/08/2018  . Zoster Recombinat (Shingrix) 10/26/2017, 11/07/2017, 12/27/2017     Family History: Family History  Problem Relation Age of Onset  . Breast cancer Neg Hx     Social History: She is divorced. Currently retired.  Social History   Socioeconomic History  . Marital status: Married    Spouse name: Not on file  . Number of children: Not on file  . Years of education: Not on file  . Highest education level: Not on file  Occupational History  . Not on file  Tobacco Use  . Smoking status: Never Smoker  . Smokeless tobacco: Never Used  Substance and Sexual Activity  . Alcohol use: Not Currently  . Drug use: Never  . Sexual activity: Not on file  Other Topics Concern  . Not on file  Social History Narrative  . Not on file   Social Determinants of Health   Financial Resource Strain: Not on file  Food Insecurity: Not on file  Transportation Needs: Not on file  Physical Activity: Not on file  Stress: Not on file  Social Connections: Not on file  Intimate Partner Violence: Not on file   Immunization History  Administered Date(s) Administered  . PFIZER(Purple Top)SARS-COV-2 Vaccination 04/14/2019, 04/24/2019, 05/15/2019, 05/15/2019  . Pneumococcal Conjugate-13 10/26/2017, 11/07/2017  . Pneumococcal Polysaccharide-23 11/08/2018  . Zoster Recombinat (Shingrix) 10/26/2017, 11/07/2017, 12/27/2017  Allergies: Allergies  Allergen Reactions  . Cephalexin Other (See Comments)  . Penicillins Rash    Current Medications: Current Outpatient Medications  Medication Sig Dispense Refill  . acetaminophen-codeine (TYLENOL #3) 300-30 MG tablet Take 1 tablet by mouth every 6 (six) hours as needed for severe pain. 15 tablet 0  . ADVAIR DISKUS 250-50 MCG/DOSE AEPB SMARTSIG:1 Puff(s) Via  Inhaler Every 12 Hours    . alendronate (FOSAMAX) 70 MG tablet TAKE 1 TABLET BY MOUTH EVERY 7 DAYS WITH A FULL GLASS OF WATER. DO NOT LIE DOWN FOR THE NEXT 30 MINUTES    . aspirin 325 MG tablet Take by mouth. (Patient not taking: Reported on 01/30/2020)    . Calcium Carbonate-Vitamin D3 600-400 MG-UNIT TABS Take by mouth.    . ciprofloxacin (CIPRO) 250 MG tablet Take 250 mg by mouth 2 (two) times daily. (Patient not taking: Reported on 12/26/2019)    . cyclobenzaprine (FLEXERIL) 5 MG tablet Take 1 tablet (5 mg total) by mouth 3 (three) times daily as needed for muscle spasms (back pain). (Patient not taking: Reported on 12/26/2019) 30 tablet 0  . enoxaparin (LOVENOX) 40 MG/0.4ML injection Inject into the skin.    Marland Kitchen Fluticasone-Salmeterol (ADVAIR) 250-50 MCG/DOSE AEPB Inhale into the lungs.    Marland Kitchen gemfibrozil (LOPID) 600 MG tablet Take 600 mg by mouth 2 (two) times daily.    Marland Kitchen HYDROcodone-acetaminophen (NORCO) 5-325 MG tablet Take 1 tablet by mouth every 8 (eight) hours as needed for moderate pain or severe pain. 30 tablet 0  . metFORMIN (GLUCOPHAGE) 1000 MG tablet Take 1,000 mg by mouth 2 (two) times daily.    . metoprolol tartrate (LOPRESSOR) 25 MG tablet Take 1 tablet by mouth 2 (two) times daily.    . nateglinide (STARLIX) 120 MG tablet Take 120 mg by mouth 3 (three) times daily.    Marland Kitchen omega-3 fish oil (MAXEPA) 1000 MG CAPS capsule Take by mouth.    Marland Kitchen omeprazole (PRILOSEC) 20 MG capsule Take 20 mg by mouth daily.     . pravastatin (PRAVACHOL) 20 MG tablet Take 20 mg by mouth daily.    Marland Kitchen rOPINIRole (REQUIP) 0.25 MG tablet TAKE 1 TABLET BY MOUTH EVERY NIGHT AT NIGHT, MAY TITRATE BY 1 TABLET EVERY 2 TO 3 DAYS AS NEEDED.     No current facility-administered medications for this visit.    Review of Systems General:  no complaints Skin: no complaints Eyes: no complaints HEENT: no complaints Breasts: no complaints Pulmonary: no complaints Cardiac: no complaints Gastrointestinal: no  complaints Genitourinary/Sexual: no complaints Ob/Gyn: no complaints Musculoskeletal: no complaints Hematology: no complaints Neurologic/Psych: no complaints  Objective:  Physical Examination:  LMP 08/12/2003 (Approximate)    ECOG Performance Status: 0 - Asymptomatic  GENERAL: Patient is a well appearing female in no acute distress HEENT:  Sclera clear. Anicteric NODES:  Negative axillary, supraclavicular, inguinal lymph node survery LUNGS:  Clear to auscultation bilaterally.   HEART:  Regular rate and rhythm.  ABDOMEN:  Soft, nontender.  No hernias, incisions well healed. No masses or ascites EXTREMITIES:  No peripheral edema. Atraumatic. No cyanosis SKIN:  Clear with no obvious rashes or skin changes.  NEURO:  Nonfocal. Well oriented.  Appropriate affect. Groins: incisions well healed, no adenopathy  Pelvic: Exam chaperoned by nursing. External genitalia: vulva well healed. No lesions seen. Vagina: normal. Rectal exam: deferred.   Lab Review  none    Assessment:  DAHIANA KULAK is a 70 y.o. female diagnosed with stage IB vulvar cancer. Biopsy  on 10/15 not diagnostic but suspicious for squamous cell carcinoma. Underwent radical vulvectomy with Dr. Fransisca Connors on 01/15/20. Inguinal node dissection deferred due to inguinal candidal infection discovered at time of surgery. Final pathology with at least IB squamous cell carcinoma of the vulva. Tumor size 2.9 cm, depth of invasion 11 mm. Negative margin, closest margin 6 mm. Negative SLN mapping. Returns for continued surveillance. NED   Medical co-morbidities complicating care: HTN, diabetes and obesity, MI (2004 s/p stent), emphysema Plan:   Problem List Items Addressed This Visit      Genitourinary   Vulvar cancer ([place]) - Primary     The patient's diagnosis, an outline of the further diagnostic and laboratory studies which will be required, the recommendation, and alternatives were discussed.  All questions were answered to the  patient's satisfaction.  RTC 4 months.  Verlon Au, NP  I personally interviewed and examined the patient. Agreed with the above/below plan of care. I have directly contributed to assessment and plan of care of this patient and educated and discussed with patient and family.  Mellody Drown, MD   CC:  Mary[place] Pink, MD [address] [place],  [place] [address] [phone]

## 2020-09-03 ENCOUNTER — Other Ambulatory Visit: Payer: Self-pay | Admitting: Family Medicine

## 2020-09-03 DIAGNOSIS — Z1231 Encounter for screening mammogram for malignant neoplasm of breast: Secondary | ICD-10-CM

## 2020-09-11 ENCOUNTER — Inpatient Hospital Stay: Payer: Medicare HMO | Attending: Obstetrics and Gynecology | Admitting: Obstetrics and Gynecology

## 2020-09-11 VITALS — BP 113/60 | HR 76 | Temp 98.5°F | Resp 18 | Wt 195.0 lb

## 2020-09-11 DIAGNOSIS — J449 Chronic obstructive pulmonary disease, unspecified: Secondary | ICD-10-CM | POA: Insufficient documentation

## 2020-09-11 DIAGNOSIS — R0602 Shortness of breath: Secondary | ICD-10-CM | POA: Insufficient documentation

## 2020-09-11 DIAGNOSIS — Z79899 Other long term (current) drug therapy: Secondary | ICD-10-CM | POA: Insufficient documentation

## 2020-09-11 DIAGNOSIS — M549 Dorsalgia, unspecified: Secondary | ICD-10-CM | POA: Insufficient documentation

## 2020-09-11 DIAGNOSIS — Z90710 Acquired absence of both cervix and uterus: Secondary | ICD-10-CM | POA: Diagnosis not present

## 2020-09-11 DIAGNOSIS — E119 Type 2 diabetes mellitus without complications: Secondary | ICD-10-CM | POA: Insufficient documentation

## 2020-09-11 DIAGNOSIS — Z7984 Long term (current) use of oral hypoglycemic drugs: Secondary | ICD-10-CM | POA: Diagnosis not present

## 2020-09-11 DIAGNOSIS — C519 Malignant neoplasm of vulva, unspecified: Secondary | ICD-10-CM

## 2020-09-11 DIAGNOSIS — I10 Essential (primary) hypertension: Secondary | ICD-10-CM | POA: Diagnosis not present

## 2020-09-11 DIAGNOSIS — I252 Old myocardial infarction: Secondary | ICD-10-CM | POA: Diagnosis not present

## 2020-09-11 DIAGNOSIS — Z9079 Acquired absence of other genital organ(s): Secondary | ICD-10-CM | POA: Diagnosis not present

## 2020-09-11 DIAGNOSIS — Z8544 Personal history of malignant neoplasm of other female genital organs: Secondary | ICD-10-CM

## 2020-09-11 NOTE — Progress Notes (Signed)
Gynecologic Oncology Interval Visit   Referring Provider: Dr Leonides Schanz  Chief Concern: vulvar cancer  Subjective:  Kimberly Bean is a 70 y.o. 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   She has complaints today of SOB (she has a h/o COPD) and back pain (diagnosed with stenosis per her report). No vulvar complaints.   Gynecologic Oncology History:  Seen by Dr Leonides Schanz 12/22/19 in consultation from Dr Kary Kos.  She reports a vulvar lesion that has been ongoing since April this year. She has tried multiple things to help get rid of the lesion but nothing has worked. Intermittently has bleeding from the site.  Dr Leonides Schanz reports a clitoral mass that is about 2cm x 2cm, ulcerated and white plaques present. Mass noted behind clitoral lesion extending internally and towards the right labia by approximately 2cm.  Dr Leonides Schanz did periclitoral biopsy and results still pending,  Patient has had a hysterectomy. 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 Fransisca Connors. Final path: at least 1B scc of vulva, tumor size 2.9 cm, DOI 11 mm, negative margin, closest margin 6 mm. Inguinal lymph nodes not removed due to severe yeast infection.   On 02/20/20 she underwent bilateral inguinal SLN mapping and biopsies with three negative nodes on the right and one negative node on the left.   Case discussed at [place] and surveillance was recommended. She completed course of lovenox.   Clinically NED.    Past Medical History: Past Medical History:  Diagnosis Date   Diabetes mellitus without complication ([place])    Herpes    Last outbreak 1980   Hypertension     Past Surgical History: Past Surgical History:  Procedure Laterality Date   ABDOMINAL HYSTERECTOMY     BREAST EXCISIONAL  BIOPSY Right 2000s   neg   GALLBLADDER SURGERY     HERNIA REPAIR     Immunization History  Administered Date(s) Administered   PFIZER(Purple Top)SARS-COV-2 Vaccination 04/14/2019, 04/24/2019, 05/15/2019, 05/15/2019   Pneumococcal Conjugate-13 10/26/2017, 11/07/2017   Pneumococcal Polysaccharide-23 11/08/2018   Zoster Recombinat (Shingrix) 10/26/2017, 11/07/2017, 12/27/2017     Family History: Family History  Problem Relation Age of Onset   Breast cancer Neg Hx     Social History: She is divorced. Currently retired.  Social History   Socioeconomic History   Marital status: Married    Spouse name: Not on file   Number of children: Not on file   Years of education: Not on file   Highest education level: Not on file  Occupational History   Not on file  Tobacco Use   Smoking status: Never   Smokeless tobacco: Never  Substance and Sexual Activity   Alcohol use: Not Currently   Drug use: Never   Sexual activity: Not on file  Other Topics Concern   Not on file  Social History Narrative   Not on file   Social Determinants of Health   Financial Resource Strain: Not on file  Food Insecurity: Not on file  Transportation Needs: Not on file  Physical Activity: Not on file  Stress: Not on file  Social Connections: Not on file  Intimate Partner Violence: Not on file   Immunization History  Administered Date(s) Administered   PFIZER(Purple Top)SARS-COV-2 Vaccination 04/14/2019, 04/24/2019, 05/15/2019,  05/15/2019   Pneumococcal Conjugate-13 10/26/2017, 11/07/2017   Pneumococcal Polysaccharide-23 11/08/2018   Zoster Recombinat (Shingrix) 10/26/2017, 11/07/2017, 12/27/2017    Allergies: Allergies  Allergen Reactions   Cephalexin Other (See Comments)   Penicillins Rash    Current Medications: Current Outpatient Medications  Medication Sig Dispense Refill   ADVAIR DISKUS 250-50 MCG/DOSE AEPB SMARTSIG:1 Puff(s) Via Inhaler Every 12 Hours     alendronate (FOSAMAX) 70 MG  tablet TAKE 1 TABLET BY MOUTH EVERY 7 DAYS WITH A FULL GLASS OF WATER. DO NOT LIE DOWN FOR THE NEXT 30 MINUTES     ascorbic acid (VITAMIN C) 1000 MG tablet Take by mouth.     aspirin 325 MG tablet Take by mouth.     Calcium Carbonate-Vitamin D3 600-400 MG-UNIT TABS Take by mouth.     gemfibrozil (LOPID) 600 MG tablet Take 600 mg by mouth 2 (two) times daily.     metFORMIN (GLUCOPHAGE) 1000 MG tablet Take 1,000 mg by mouth 2 (two) times daily.     metoprolol tartrate (LOPRESSOR) 25 MG tablet Take 1 tablet by mouth 2 (two) times daily.     nateglinide (STARLIX) 120 MG tablet Take 120 mg by mouth 3 (three) times daily.     omega-3 fish oil (MAXEPA) 1000 MG CAPS capsule Take by mouth.     omeprazole (PRILOSEC) 20 MG capsule Take 20 mg by mouth daily.      pravastatin (PRAVACHOL) 20 MG tablet Take 20 mg by mouth daily.     rOPINIRole (REQUIP) 0.25 MG tablet TAKE 1 TABLET BY MOUTH EVERY NIGHT AT NIGHT, MAY TITRATE BY 1 TABLET EVERY 2 TO 3 DAYS AS NEEDED.     acetaminophen-codeine (TYLENOL #3) 300-30 MG tablet Take 1 tablet by mouth every 6 (six) hours as needed for severe pain. (Patient not taking: No sig reported) 15 tablet 0   ciprofloxacin (CIPRO) 250 MG tablet Take 250 mg by mouth 2 (two) times daily. (Patient not taking: No sig reported)     cyclobenzaprine (FLEXERIL) 5 MG tablet Take 1 tablet (5 mg total) by mouth 3 (three) times daily as needed for muscle spasms (back pain). (Patient not taking: No sig reported) 30 tablet 0   enoxaparin (LOVENOX) 40 MG/0.4ML injection Inject into the skin.     Fluticasone-Salmeterol (ADVAIR) 250-50 MCG/DOSE AEPB Inhale into the lungs.     HYDROcodone-acetaminophen (NORCO) 5-325 MG tablet Take 1 tablet by mouth every 8 (eight) hours as needed for moderate pain or severe pain. (Patient not taking: No sig reported) 30 tablet 0   No current facility-administered medications for this visit.    Review of Systems General: no complaints  HEENT: no complaints   Lungs: shortness of breath  Cardiac: no complaints  GI: no complaints  GU: no complaints  Musculoskeletal: back pain  Extremities: no complaints  Skin: no complaints  Neuro: no complaints  Endocrine: no complaints  Psych: no complaints       Objective:  Physical Examination:  BP 113/60 (Patient Position: Sitting)   Pulse 76   Temp 98.5 F (36.9 C) (Oral)   Resp 18   Wt 195 lb (88.5 kg)   LMP 08/12/2003 (Approximate)   SpO2 92%   BMI 38.08 kg/m    ECOG Performance Status: 1  GENERAL: Patient is a well appearing female in no acute distress HEENT:  PERRL, neck supple with midline trachea. Thyroid without masses.  NODES:  No cervical, supraclavicular, axillary, or inguinal lymphadenopathy palpated.  LUNGS:  Clear to auscultation  bilaterally.   HEART:  Regular rate and rhythm.  ABDOMEN:  Soft, nontender, nondistended. No masses or ascites EXTREMITIES:  No peripheral edema.   Incisions: well healed LUQ and left groin NEURO:  Nonfocal. Well oriented.  Appropriate affect.  Pelvic: EGBUS: no lesions Cervix: surgically absent Vagina: no lesions, no discharge or bleeding Uterus: surgically absent BME: no palpable masses   Lab Review  none    Assessment:  Kimberly Bean is a 70 y.o. female diagnosed with stage IB vulvar cancer. Biopsy on 10/15 not diagnostic but suspicious for squamous cell carcinoma. Underwent radical vulvectomy with Dr. Fransisca Connors on 01/15/20. Inguinal node dissection deferred due to inguinal candidal infection discovered at time of surgery. Final pathology with at least IB squamous cell carcinoma of the vulva. Tumor size 2.9 cm, depth of invasion 11 mm. Negative margin, closest margin 6 mm. Negative SLN mapping. Returns for continued surveillance. NED   Medical co-morbidities complicating care: HTN, diabetes and obesity, MI (2004 s/p stent), emphysema Plan:   Problem List Items Addressed This Visit       Genitourinary   Vulvar cancer ([place]) -  Primary    RTC 4 months. Per NCCN guidelines interval H&P every 3-6 mo for 2 y, every 6-12 mo for 3-5 y, then annually based on patient's risk of  disease recurrence. Cervical/vaginal cytology screening is not indicated s/p hysterectomy. Imaging and labs as indicated based on symptoms or examination findings suspicious for recurrence.  Continue to follow with her PCP regarding other medical issues.   Kimberly Salamone Gaetana Michaelis, MD

## 2020-09-11 NOTE — Patient Instructions (Signed)
Please contact [facility] to schedule a surveillance visit with them in 8 months. We will see you in 4 months but would like to begin alternating visits with them after that time. Thank you for allowing [place] to participate in your care.

## 2020-10-01 DIAGNOSIS — M1711 Unilateral primary osteoarthritis, right knee: Secondary | ICD-10-CM | POA: Insufficient documentation

## 2021-01-15 ENCOUNTER — Other Ambulatory Visit: Payer: Self-pay

## 2021-01-15 ENCOUNTER — Encounter (INDEPENDENT_AMBULATORY_CARE_PROVIDER_SITE_OTHER): Payer: Self-pay

## 2021-01-15 ENCOUNTER — Inpatient Hospital Stay: Payer: Medicare HMO | Attending: Obstetrics and Gynecology | Admitting: Nurse Practitioner

## 2021-01-15 VITALS — BP 141/57 | HR 65 | Temp 97.8°F | Resp 20 | Wt 195.6 lb

## 2021-01-15 DIAGNOSIS — Z08 Encounter for follow-up examination after completed treatment for malignant neoplasm: Secondary | ICD-10-CM

## 2021-01-15 DIAGNOSIS — Z9079 Acquired absence of other genital organ(s): Secondary | ICD-10-CM | POA: Insufficient documentation

## 2021-01-15 DIAGNOSIS — I10 Essential (primary) hypertension: Secondary | ICD-10-CM | POA: Diagnosis not present

## 2021-01-15 DIAGNOSIS — Z6838 Body mass index (BMI) 38.0-38.9, adult: Secondary | ICD-10-CM | POA: Insufficient documentation

## 2021-01-15 DIAGNOSIS — C519 Malignant neoplasm of vulva, unspecified: Secondary | ICD-10-CM | POA: Diagnosis present

## 2021-01-15 DIAGNOSIS — E669 Obesity, unspecified: Secondary | ICD-10-CM | POA: Insufficient documentation

## 2021-01-15 DIAGNOSIS — Z8544 Personal history of malignant neoplasm of other female genital organs: Secondary | ICD-10-CM | POA: Diagnosis not present

## 2021-01-15 DIAGNOSIS — I252 Old myocardial infarction: Secondary | ICD-10-CM | POA: Diagnosis not present

## 2021-01-15 DIAGNOSIS — Z7984 Long term (current) use of oral hypoglycemic drugs: Secondary | ICD-10-CM | POA: Insufficient documentation

## 2021-01-15 DIAGNOSIS — Z955 Presence of coronary angioplasty implant and graft: Secondary | ICD-10-CM | POA: Insufficient documentation

## 2021-01-15 DIAGNOSIS — E119 Type 2 diabetes mellitus without complications: Secondary | ICD-10-CM | POA: Insufficient documentation

## 2021-01-15 NOTE — Progress Notes (Signed)
Gynecologic Oncology Interval Visit   Referring Provider: Dr Ward-Dr. Ouida Sills  Chief Concern: vulvar cancer  Subjective:  Kimberly Bean is a 70 y.o. 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   She feels well and denies specific complaints other than chronic knee pain. Her son is having surgery for presumed cancer of unknown type at [facility] in the next month and she will be helping him recover. No vulvar complaints.     Gynecologic Oncology History:  Seen by Dr Leonides Schanz 12/22/19 in consultation from Dr Kary Kos.  She reports a vulvar lesion that has been ongoing since April this year. She has tried multiple things to help get rid of the lesion but nothing has worked. Intermittently has bleeding from the site.  Dr Leonides Schanz reports a clitoral mass that is about 2cm x 2cm, ulcerated and white plaques present. Mass noted behind clitoral lesion extending internally and towards the right labia by approximately 2cm.  Dr Leonides Schanz did periclitoral biopsy and results still pending,  Patient has had a hysterectomy. 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 Fransisca Connors. Final path: at least 1B scc of vulva, tumor size 2.9 cm, DOI 11 mm, negative margin, closest margin 6 mm. Inguinal lymph nodes not removed due to severe yeast infection.   On 02/20/20 she underwent bilateral inguinal SLN mapping and biopsies with three negative nodes on the right and one negative node on the left.   Case discussed at [place] and surveillance was recommended. She completed course of lovenox.   Clinically NED.    Past Medical History: Past Medical History:  Diagnosis Date   Diabetes mellitus without complication ([place])    Herpes    Last outbreak 1980   Hypertension     Past Surgical  History: Past Surgical History:  Procedure Laterality Date   ABDOMINAL HYSTERECTOMY     BREAST EXCISIONAL BIOPSY Right 2000s   neg   GALLBLADDER SURGERY     HERNIA REPAIR     Immunization History  Administered Date(s) Administered   PFIZER(Purple Top)SARS-COV-2 Vaccination 04/14/2019, 04/24/2019, 05/15/2019, 05/15/2019   Pneumococcal Conjugate-13 10/26/2017, 11/07/2017   Pneumococcal Polysaccharide-23 11/08/2018   Zoster Recombinat (Shingrix) 10/26/2017, 11/07/2017, 12/27/2017     Family History: Family History  Problem Relation Age of Onset   Breast cancer Neg Hx     Social History: She is divorced. Currently retired.  Social History   Socioeconomic History   Marital status: Married    Spouse name: Not on file   Number of children: Not on file   Years of education: Not on file   Highest education level: Not on file  Occupational History   Not on file  Tobacco Use   Smoking status: Never   Smokeless tobacco: Never  Substance and Sexual Activity   Alcohol use: Not Currently   Drug use: Never   Sexual activity: Not on file  Other Topics Concern   Not on file  Social History Narrative   Not on file   Social Determinants of Health   Financial Resource Strain: Not on file  Food Insecurity: Not on file  Transportation Needs: Not on file  Physical Activity: Not on file  Stress: Not on file  Social Connections: Not on file  Intimate Partner Violence:  Not on file   Immunization History  Administered Date(s) Administered   PFIZER(Purple Top)SARS-COV-2 Vaccination 04/14/2019, 04/24/2019, 05/15/2019, 05/15/2019   Pneumococcal Conjugate-13 10/26/2017, 11/07/2017   Pneumococcal Polysaccharide-23 11/08/2018   Zoster Recombinat (Shingrix) 10/26/2017, 11/07/2017, 12/27/2017    Allergies: Allergies  Allergen Reactions   Cephalexin Other (See Comments)   Penicillins Rash    Current Medications: Current Outpatient Medications  Medication Sig Dispense Refill    acetaminophen-codeine (TYLENOL #3) 300-30 MG tablet Take 1 tablet by mouth every 6 (six) hours as needed for severe pain. (Patient not taking: No sig reported) 15 tablet 0   ADVAIR DISKUS 250-50 MCG/DOSE AEPB SMARTSIG:1 Puff(s) Via Inhaler Every 12 Hours     alendronate (FOSAMAX) 70 MG tablet TAKE 1 TABLET BY MOUTH EVERY 7 DAYS WITH A FULL GLASS OF WATER. DO NOT LIE DOWN FOR THE NEXT 30 MINUTES     ascorbic acid (VITAMIN C) 1000 MG tablet Take by mouth.     aspirin 325 MG tablet Take by mouth.     Calcium Carbonate-Vitamin D3 600-400 MG-UNIT TABS Take by mouth.     ciprofloxacin (CIPRO) 250 MG tablet Take 250 mg by mouth 2 (two) times daily. (Patient not taking: No sig reported)     cyclobenzaprine (FLEXERIL) 5 MG tablet Take 1 tablet (5 mg total) by mouth 3 (three) times daily as needed for muscle spasms (back pain). (Patient not taking: No sig reported) 30 tablet 0   enoxaparin (LOVENOX) 40 MG/0.4ML injection Inject into the skin.     Fluticasone-Salmeterol (ADVAIR) 250-50 MCG/DOSE AEPB Inhale into the lungs.     gemfibrozil (LOPID) 600 MG tablet Take 600 mg by mouth 2 (two) times daily.     HYDROcodone-acetaminophen (NORCO) 5-325 MG tablet Take 1 tablet by mouth every 8 (eight) hours as needed for moderate pain or severe pain. (Patient not taking: No sig reported) 30 tablet 0   metFORMIN (GLUCOPHAGE) 1000 MG tablet Take 1,000 mg by mouth 2 (two) times daily.     metoprolol tartrate (LOPRESSOR) 25 MG tablet Take 1 tablet by mouth 2 (two) times daily.     nateglinide (STARLIX) 120 MG tablet Take 120 mg by mouth 3 (three) times daily.     omega-3 fish oil (MAXEPA) 1000 MG CAPS capsule Take by mouth.     omeprazole (PRILOSEC) 20 MG capsule Take 20 mg by mouth daily.      pravastatin (PRAVACHOL) 20 MG tablet Take 20 mg by mouth daily.     rOPINIRole (REQUIP) 0.25 MG tablet TAKE 1 TABLET BY MOUTH EVERY NIGHT AT NIGHT, MAY TITRATE BY 1 TABLET EVERY 2 TO 3 DAYS AS NEEDED.     No current  facility-administered medications for this visit.   Review of Systems General:  no complaints Skin: no complaints Eyes: no complaints HEENT: no complaints Breasts: no complaints Pulmonary: no complaints Cardiac: no complaints Gastrointestinal: no complaints Genitourinary/Sexual: no complaints Ob/Gyn: no complaints Musculoskeletal: no complaints Hematology: no complaints Neurologic/Psych: no complaints   Objective:  Physical Examination:  BP (!) 141/57   Pulse 65   Temp 97.8 F (36.6 C)   Resp 20   Wt 195 lb 9.6 oz (88.7 kg)   LMP 08/12/2003 (Approximate)   SpO2 100%   BMI 38.20 kg/m    ECOG Performance Status: 1  GENERAL: Patient is a well appearing female in no acute distress HEENT:  Sclera clear. Anicteric NODES:  Negative axillary, supraclavicular, inguinal lymph node survery LUNGS:  Clear to auscultation bilaterally.   HEART:  Regular rate and rhythm.  ABDOMEN:  Soft, nontender.  Rounded. Obese.  EXTREMITIES:  No peripheral edema. Atraumatic.  SKIN:  Clear with no obvious rashes or skin changes. Chronic thinning skin in inguinal folds with history of recurrent yeast NEURO:  Nonfocal. Well oriented.  Appropriate affect.  Pelvic: Exam chaperoned by CMA EGBUS: no lesions Cervix: surgically absent Vagina: no lesions, no discharge or bleeding Uterus: surgically absent BME: no palpable masses Rectal: deferred  Lab Review  none    Assessment:  BURNETTA KOHLS is a 70 y.o. female diagnosed with stage IB vulvar cancer. Biopsy on 10/15 not diagnostic but suspicious for squamous cell carcinoma. Underwent radical vulvectomy with Dr. Fransisca Connors on 01/15/20. Inguinal node dissection deferred due to inguinal candidal infection discovered at time of surgery. Final pathology with at least IB squamous cell carcinoma of the vulva. Tumor size 2.9 cm, depth of invasion 11 mm. Negative margin, closest margin 6 mm. Negative SLN mapping. Returns for continued surveillance. Clinically  asymptomatic and NED on exam.   Medical co-morbidities complicating care: HTN, diabetes and obesity, MI (2004 s/p stent), emphysema Plan:   Problem List Items Addressed This Visit       Genitourinary   Vulvar cancer ([place])   Relevant Orders   IGP, Aptima HPV   Other Visit Diagnoses     Encounter for follow-up surveillance of vulvar cancer    -  Primary       Will begin rotation with Dr. Ouida Sills whom she will see in 4 months. We will plan to see her back in 8 months. Reviewed surveillance guidelines with visits every 3-6 months for first 2 years then every 6-12 months for years 3-5 then annually based on risk. Reviewed that cervical/vaginal cytology not indicated post hysterectomy however, based on Dr. Blake Divine recommendation, pap collected today. Would consider imaging based on clinical symptoms.   She will continue to follow up with her pcp and ortho for chronic medical issues and cancer screenings.   Verlon Au, NP

## 2021-01-18 LAB — IGP, APTIMA HPV

## 2021-01-20 ENCOUNTER — Telehealth: Payer: Self-pay

## 2021-01-20 NOTE — Telephone Encounter (Signed)
Per Kimberly Bean I called patient to let her know he pap is negative for atypical or malignant cells. Patient stated she understood and was happy with results.

## 2021-05-06 ENCOUNTER — Ambulatory Visit
Admission: RE | Admit: 2021-05-06 | Discharge: 2021-05-06 | Disposition: A | Discharge status: Home / Self Care | Payer: Medicare HMO | Source: Ambulatory Visit | Attending: Family Medicine | Admitting: Family Medicine

## 2021-05-06 ENCOUNTER — Other Ambulatory Visit: Payer: Self-pay

## 2021-05-06 DIAGNOSIS — Z1231 Encounter for screening mammogram for malignant neoplasm of breast: Secondary | ICD-10-CM | POA: Diagnosis not present

## 2021-09-17 ENCOUNTER — Inpatient Hospital Stay: Payer: Medicare HMO | Attending: Obstetrics and Gynecology | Admitting: Obstetrics and Gynecology

## 2021-09-17 VITALS — BP 124/62 | HR 66 | Resp 18 | Wt 199.0 lb

## 2021-09-17 DIAGNOSIS — C519 Malignant neoplasm of vulva, unspecified: Secondary | ICD-10-CM

## 2021-09-17 DIAGNOSIS — Z8544 Personal history of malignant neoplasm of other female genital organs: Secondary | ICD-10-CM | POA: Diagnosis present

## 2021-09-17 DIAGNOSIS — Z9079 Acquired absence of other genital organ(s): Secondary | ICD-10-CM | POA: Diagnosis not present

## 2021-09-17 NOTE — Progress Notes (Signed)
Patient complains of back & shoulder pain and SOB due to COPD.

## 2021-09-17 NOTE — Progress Notes (Signed)
Gynecologic Oncology Interval Visit   Referring Provider: Dr Ward-Dr. Ouida Sills  Chief Concern: vulvar cancer  Subjective:  Kimberly Bean is a 71 y.o. 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   She feels well and denies specific complaints other than chronic knee pain. No vulvar complaints.  Saw Dr Ouida Sills 3-4 months ago and normal exam.  PAP 11/22 negative.   Gynecologic Oncology History:  Seen by Dr Leonides Schanz 12/22/19 in consultation from Dr Kary Kos.  She reports a vulvar lesion that has been ongoing since April this year. She has tried multiple things to help get rid of the lesion but nothing has worked. Intermittently has bleeding from the site.  Dr Leonides Schanz reports a clitoral mass that is about 2cm x 2cm, ulcerated and white plaques present. Mass noted behind clitoral lesion extending internally and towards the right labia by approximately 2cm.  Dr Leonides Schanz did periclitoral biopsy and results still pending,  Patient has had a hysterectomy. 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 Fransisca Connors. Final path: at least 1B scc of vulva, tumor size 2.9 cm, DOI 11 mm, negative margin, closest margin 6 mm. Inguinal lymph nodes not removed due to severe yeast infection.   On 02/20/20 she underwent bilateral inguinal SLN mapping and biopsies with three negative nodes on the right and one negative node on the left.   Case discussed at [place] and surveillance was recommended. She completed course of lovenox.   Clinically NED.    Past Medical History: Past Medical History:  Diagnosis Date   Diabetes mellitus without complication ([place])    Herpes    Last outbreak 1980   Hypertension     Past Surgical History: Past Surgical History:  Procedure  Laterality Date   ABDOMINAL HYSTERECTOMY     BREAST EXCISIONAL BIOPSY Right 2000s   neg   GALLBLADDER SURGERY     HERNIA REPAIR     Immunization History  Administered Date(s) Administered   PFIZER(Purple Top)SARS-COV-2 Vaccination 04/14/2019, 04/24/2019, 05/15/2019, 05/15/2019   Pneumococcal Conjugate-13 10/26/2017, 11/07/2017   Pneumococcal Polysaccharide-23 11/08/2018   Zoster Recombinat (Shingrix) 10/26/2017, 11/07/2017, 12/27/2017     Family History: Family History  Problem Relation Age of Onset   Breast cancer Neg Hx     Social History: She is divorced. Currently retired.  Social History   Socioeconomic History   Marital status: Married    Spouse name: Not on file   Number of children: Not on file   Years of education: Not on file   Highest education level: Not on file  Occupational History   Not on file  Tobacco Use   Smoking status: Never   Smokeless tobacco: Never  Substance and Sexual Activity   Alcohol use: Not Currently   Drug use: Never   Sexual activity: Not on file  Other Topics Concern   Not on file  Social History Narrative   Not on file   Social Determinants of Health   Financial Resource Strain: Not on file  Food Insecurity: Not on file  Transportation Needs: Not on file  Physical Activity: Not on file  Stress: Not on file  Social Connections: Not on file  Intimate Partner Violence: Not on file   Immunization History  Administered Date(s) Administered  PFIZER(Purple Top)SARS-COV-2 Vaccination 04/14/2019, 04/24/2019, 05/15/2019, 05/15/2019   Pneumococcal Conjugate-13 10/26/2017, 11/07/2017   Pneumococcal Polysaccharide-23 11/08/2018   Zoster Recombinat (Shingrix) 10/26/2017, 11/07/2017, 12/27/2017    Allergies: Allergies  Allergen Reactions   Cephalexin Other (See Comments)   Penicillins Rash    Current Medications: Current Outpatient Medications  Medication Sig Dispense Refill   acetaminophen-codeine (TYLENOL #3) 300-30 MG  tablet Take 1 tablet by mouth every 6 (six) hours as needed for severe pain. (Patient not taking: No sig reported) 15 tablet 0   ADVAIR DISKUS 250-50 MCG/DOSE AEPB SMARTSIG:1 Puff(s) Via Inhaler Every 12 Hours     alendronate (FOSAMAX) 70 MG tablet TAKE 1 TABLET BY MOUTH EVERY 7 DAYS WITH A FULL GLASS OF WATER. DO NOT LIE DOWN FOR THE NEXT 30 MINUTES     ascorbic acid (VITAMIN C) 1000 MG tablet Take by mouth.     aspirin 325 MG tablet Take by mouth.     Calcium Carbonate-Vitamin D3 600-400 MG-UNIT TABS Take by mouth.     ciprofloxacin (CIPRO) 250 MG tablet Take 250 mg by mouth 2 (two) times daily. (Patient not taking: No sig reported)     cyclobenzaprine (FLEXERIL) 5 MG tablet Take 1 tablet (5 mg total) by mouth 3 (three) times daily as needed for muscle spasms (back pain). (Patient not taking: No sig reported) 30 tablet 0   enoxaparin (LOVENOX) 40 MG/0.4ML injection Inject into the skin.     Fluticasone-Salmeterol (ADVAIR) 250-50 MCG/DOSE AEPB Inhale into the lungs.     gemfibrozil (LOPID) 600 MG tablet Take 600 mg by mouth 2 (two) times daily.     HYDROcodone-acetaminophen (NORCO) 5-325 MG tablet Take 1 tablet by mouth every 8 (eight) hours as needed for moderate pain or severe pain. (Patient not taking: No sig reported) 30 tablet 0   metFORMIN (GLUCOPHAGE) 1000 MG tablet Take 1,000 mg by mouth 2 (two) times daily.     metoprolol tartrate (LOPRESSOR) 25 MG tablet Take 1 tablet by mouth 2 (two) times daily.     nateglinide (STARLIX) 120 MG tablet Take 120 mg by mouth 3 (three) times daily.     omega-3 fish oil (MAXEPA) 1000 MG CAPS capsule Take by mouth.     omeprazole (PRILOSEC) 20 MG capsule Take 20 mg by mouth daily.      pravastatin (PRAVACHOL) 20 MG tablet Take 20 mg by mouth daily.     rOPINIRole (REQUIP) 0.25 MG tablet TAKE 1 TABLET BY MOUTH EVERY NIGHT AT NIGHT, MAY TITRATE BY 1 TABLET EVERY 2 TO 3 DAYS AS NEEDED.     No current facility-administered medications for this visit.    Review of Systems General:  no complaints Skin: no complaints Eyes: no complaints HEENT: no complaints Breasts: no complaints Pulmonary: no complaints Cardiac: no complaints Gastrointestinal: no complaints Genitourinary/Sexual: no complaints Ob/Gyn: no complaints Musculoskeletal: no complaints Hematology: no complaints Neurologic/Psych: no complaints   Objective:  Physical Examination:  Vitals:   09/17/21 1357 09/17/21 1400  BP:  124/62  Pulse:  66  Resp: 18    ECOG Performance Status: 1  GENERAL: Patient is a well appearing female in no acute distress HEENT:  Sclera clear. Anicteric NODES:  Negative axillary, supraclavicular, inguinal lymph node survery LUNGS:  Clear to auscultation bilaterally.   HEART:  Regular rate and rhythm.  ABDOMEN:  Soft, nontender.  Rounded. Obese.  EXTREMITIES:  No peripheral edema. Atraumatic.  SKIN:  Clear with no obvious rashes or skin changes. Chronic thinning skin in inguinal  folds with history of recurrent yeast NEURO:  Nonfocal. Well oriented.  Appropriate affect.  Pelvic: Exam chaperoned by Nursing EGBUS: no lesions Cervix: surgically absent Vagina: no lesions, no discharge or bleeding Uterus: surgically absent BME: no palpable masses Rectal: deferred  Lab Review  none    Assessment:  Kimberly Bean is a 71 y.o. female diagnosed with stage IB vulvar cancer. Biopsy on 10/15 not diagnostic but suspicious for squamous cell carcinoma. Underwent radical vulvectomy with Dr. Fransisca Connors on 01/15/20. Inguinal node dissection deferred due to inguinal candidal infection discovered at time of surgery. Final pathology with at least IB squamous cell carcinoma of the vulva. Tumor size 2.9 cm, depth of invasion 11 mm. Negative margin, closest margin 6 mm. Negative SLN mapping. Returns for continued surveillance. Clinically asymptomatic and NED on exam.   Medical co-morbidities complicating care: HTN, diabetes and obesity, MI (2004 s/p stent),  emphysema Plan:   Problem List Items Addressed This Visit       Genitourinary   Vulvar cancer ([place]) - Primary   We will plan to see her back in 4 months. Would consider imaging based on clinical symptoms or exam findings.   She will continue to follow up with her pcp and ortho for chronic medical issues and cancer screenings.   Verlon Au, NP  I personally interviewed and examined the patient. Agreed with the above/below plan of care. I have directly contributed to assessment and plan of care of this patient and educated and discussed with patient and family.  Mellody Drown, MD

## 2022-01-21 ENCOUNTER — Encounter: Payer: Self-pay | Admitting: Obstetrics and Gynecology

## 2022-01-21 ENCOUNTER — Inpatient Hospital Stay: Payer: Medicare HMO | Attending: Obstetrics and Gynecology | Admitting: Obstetrics and Gynecology

## 2022-01-21 VITALS — BP 142/63 | HR 70 | Temp 98.6°F | Resp 16 | Wt 196.2 lb

## 2022-01-21 DIAGNOSIS — Z90710 Acquired absence of both cervix and uterus: Secondary | ICD-10-CM | POA: Diagnosis not present

## 2022-01-21 DIAGNOSIS — Z8544 Personal history of malignant neoplasm of other female genital organs: Secondary | ICD-10-CM | POA: Diagnosis not present

## 2022-01-21 DIAGNOSIS — C519 Malignant neoplasm of vulva, unspecified: Secondary | ICD-10-CM

## 2022-01-21 DIAGNOSIS — Z9079 Acquired absence of other genital organ(s): Secondary | ICD-10-CM | POA: Diagnosis not present

## 2022-01-21 DIAGNOSIS — N9089 Other specified noninflammatory disorders of vulva and perineum: Secondary | ICD-10-CM | POA: Diagnosis not present

## 2022-01-21 DIAGNOSIS — Z08 Encounter for follow-up examination after completed treatment for malignant neoplasm: Secondary | ICD-10-CM

## 2022-01-21 NOTE — Progress Notes (Signed)
Gynecologic Oncology Interval Visit   Referring Provider: Dr Ward-Dr. Ouida Sills  Chief Concern: vulvar cancer  Subjective:  Kimberly Bean is a 71 y.o. 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 Lynn Ito followed by bilateral SLN sampling - negative on 02/20/20, currently on surveillance, who returns to clinic for follow up.   PAP 11/22 negative. She was last seen July 2023 with negative exam. Denies vulvar complaints.   Gynecologic Oncology History:  Seen by Dr Leonides Schanz 12/22/19 in consultation from Dr Kary Kos.  She reports a vulvar lesion that has been ongoing since April this year. She has tried multiple things to help get rid of the lesion but nothing has worked. Intermittently has bleeding from the site.  Dr Leonides Schanz reports a clitoral mass that is about 2cm x 2cm, ulcerated and white plaques present. Mass noted behind clitoral lesion extending internally and towards the right labia by approximately 2cm.  Dr Leonides Schanz did periclitoral biopsy and results still pending,  Patient has had a hysterectomy. 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 Fransisca Connors. Final path: at least 1B scc of vulva, tumor size 2.9 cm, DOI 11 mm, negative margin, closest margin 6 mm. Inguinal lymph nodes not removed due to severe yeast infection.   On 02/20/20 she underwent bilateral inguinal SLN mapping and biopsies with three negative nodes on the right and one negative node on the left.   Case discussed at [place] and surveillance was recommended. She completed course of lovenox.   Clinically NED.    Past Medical History: Past Medical History:  Diagnosis Date   Diabetes mellitus without complication ([place])    Herpes    Last outbreak 1980   Hypertension     Past Surgical History: Past Surgical History:   Procedure Laterality Date   ABDOMINAL HYSTERECTOMY     BREAST EXCISIONAL BIOPSY Right 2000s   neg   GALLBLADDER SURGERY     HERNIA REPAIR     Immunization History  Administered Date(s) Administered   Influenza-Unspecified 12/19/2020   PFIZER Comirnaty(Gray Top)Covid-19 Tri-Sucrose Vaccine 04/24/2019, 05/15/2019   PFIZER(Purple Top)SARS-COV-2 Vaccination 04/14/2019, 04/24/2019, 05/15/2019, 05/15/2019   Pneumococcal Conjugate-13 10/26/2017, 11/07/2017   Pneumococcal Polysaccharide-23 11/08/2018   Zoster Recombinat (Shingrix) 10/26/2017, 11/07/2017, 12/27/2017     Family History: Family History  Problem Relation Age of Onset   Breast cancer Neg Hx     Social History: She is divorced. Currently retired.  Social History   Socioeconomic History   Marital status: Married    Spouse name: Not on file   Number of children: Not on file   Years of education: Not on file   Highest education level: Not on file  Occupational History   Not on file  Tobacco Use   Smoking status: Never   Smokeless tobacco: Never  Substance and Sexual Activity   Alcohol use: Not Currently   Drug use: Never   Sexual activity: Not on file  Other Topics Concern   Not on file  Social History Narrative   Not on file   Social Determinants of Health   Financial Resource Strain: Not on file  Food Insecurity: Not on file  Transportation Needs: Not on file  Physical Activity: Not on file  Stress: Not on file  Social Connections: Not on file  Intimate Partner  Violence: Not on file   Immunization History  Administered Date(s) Administered   Influenza-Unspecified 12/19/2020   PFIZER Comirnaty(Gray Top)Covid-19 Tri-Sucrose Vaccine 04/24/2019, 05/15/2019   PFIZER(Purple Top)SARS-COV-2 Vaccination 04/14/2019, 04/24/2019, 05/15/2019, 05/15/2019   Pneumococcal Conjugate-13 10/26/2017, 11/07/2017   Pneumococcal Polysaccharide-23 11/08/2018   Zoster Recombinat (Shingrix) 10/26/2017, 11/07/2017, 12/27/2017     Allergies: Allergies  Allergen Reactions   Cephalexin Other (See Comments)   Penicillins Rash    Current Medications: Current Outpatient Medications  Medication Sig Dispense Refill   ADVAIR DISKUS 250-50 MCG/DOSE AEPB SMARTSIG:1 Puff(s) Via Inhaler Every 12 Hours     albuterol (VENTOLIN HFA) 108 (90 Base) MCG/ACT inhaler Inhale into the lungs.     alendronate (FOSAMAX) 70 MG tablet TAKE 1 TABLET BY MOUTH EVERY 7 DAYS WITH A FULL GLASS OF WATER. DO NOT LIE DOWN FOR THE NEXT 30 MINUTES (Patient not taking: Reported on 09/17/2021)     ascorbic acid (VITAMIN C) 1000 MG tablet Take by mouth.     aspirin 325 MG tablet Take by mouth.     Calcium Carbonate-Vitamin D3 600-400 MG-UNIT TABS Take by mouth.     gemfibrozil (LOPID) 600 MG tablet Take 600 mg by mouth 2 (two) times daily.     metFORMIN (GLUCOPHAGE) 1000 MG tablet Take 1,000 mg by mouth 2 (two) times daily.     metoprolol tartrate (LOPRESSOR) 25 MG tablet Take 1 tablet by mouth 2 (two) times daily.     nateglinide (STARLIX) 120 MG tablet Take 120 mg by mouth 3 (three) times daily.     omega-3 fish oil (MAXEPA) 1000 MG CAPS capsule Take by mouth.     omeprazole (PRILOSEC) 20 MG capsule Take 20 mg by mouth daily.      pravastatin (PRAVACHOL) 40 MG tablet Take by mouth.     rOPINIRole (REQUIP) 0.25 MG tablet TAKE 1 TABLET BY MOUTH EVERY NIGHT AT NIGHT, MAY TITRATE BY 1 TABLET EVERY 2 TO 3 DAYS AS NEEDED.     No current facility-administered medications for this visit.   Review of Systems General:  no complaints Skin: no complaints Eyes: no complaints HEENT: no complaints Breasts: no complaints Pulmonary: no complaints Cardiac: no complaints Gastrointestinal: no complaints Genitourinary/Sexual: no complaints Ob/Gyn: no complaints Musculoskeletal: no complaints Hematology: no complaints Neurologic/Psych: no complaints   Objective:  Physical Examination:  Vitals:   01/21/22 1053  BP: (!) 142/63  Pulse: 70  Resp: 16   Temp: 98.6 F (37 C)   ECOG Performance Status: 1  GENERAL: Patient is a well appearing female in no acute distress HEENT:  Sclera clear. Anicteric NODES:  Negative axillary, supraclavicular, inguinal lymph node survery LUNGS:  Clear to auscultation bilaterally.   HEART:  Regular rate and rhythm.  ABDOMEN:  Soft, nontender.  Rounded. Obese.  EXTREMITIES:  No peripheral edema. Atraumatic.  SKIN:  Clear with no obvious rashes or skin changes. Chronic thinning skin in inguinal folds with history of recurrent yeast NEURO:  Nonfocal. Well oriented.  Appropriate affect.  Pelvic: Exam chaperoned by Nursing EGBUS: no lesions Cervix: surgically absent Vagina: no lesions, no discharge or bleeding Uterus: surgically absent BME: no palpable masses Rectal: deferred  Lab Review  none    Assessment:  Kimberly Bean is a 71 y.o. female diagnosed with stage IB vulvar cancer. Biopsy on 10/15 not diagnostic but suspicious for squamous cell carcinoma. Underwent radical vulvectomy with Dr. Fransisca Connors on 01/15/20. Inguinal node dissection deferred due to inguinal candidal infection discovered at time of surgery. Final pathology  with at least IB squamous cell carcinoma of the vulva. Tumor size 2.9 cm, depth of invasion 11 mm. Negative margin, closest margin 6 mm. Negative SLN mapping. Returns for continued surveillance. Clinically asymptomatic and NED on exam.   Medical co-morbidities complicating care: HTN, diabetes and obesity, MI (2004 s/p stent), emphysema Plan:   Problem List Items Addressed This Visit       Genitourinary   Vulvar cancer ([place])   Other Visit Diagnoses     Encounter for follow-up surveillance of vulvar cancer    -  Primary      We will plan to see her back in 6 months or sooner if needed. Would consider imaging based on clinical symptoms or exam findings.   She will continue to follow up with her pcp and ortho for chronic medical issues and cancer screenings.   Verlon Au, NP  I personally interviewed and examined the patient. Agreed with the above/below plan of care. I have directly contributed to assessment and plan of care of this patient and educated and discussed with patient and family.  Mellody Drown, MD

## 2022-05-19 ENCOUNTER — Other Ambulatory Visit: Payer: Self-pay | Admitting: Family Medicine

## 2022-05-19 DIAGNOSIS — Z1231 Encounter for screening mammogram for malignant neoplasm of breast: Secondary | ICD-10-CM

## 2022-06-05 ENCOUNTER — Ambulatory Visit
Admission: RE | Admit: 2022-06-05 | Discharge: 2022-06-05 | Disposition: A | Discharge status: Home / Self Care | Payer: Medicare HMO | Source: Ambulatory Visit | Attending: Family Medicine | Admitting: Family Medicine

## 2022-06-05 DIAGNOSIS — Z1231 Encounter for screening mammogram for malignant neoplasm of breast: Secondary | ICD-10-CM | POA: Insufficient documentation

## 2022-07-22 ENCOUNTER — Ambulatory Visit: Payer: Medicare HMO

## 2022-08-05 ENCOUNTER — Inpatient Hospital Stay: Payer: Medicare HMO | Attending: Obstetrics and Gynecology | Admitting: Obstetrics and Gynecology

## 2022-08-05 VITALS — BP 142/59 | HR 69 | Temp 96.6°F | Resp 18 | Wt 192.6 lb

## 2022-08-05 DIAGNOSIS — Z08 Encounter for follow-up examination after completed treatment for malignant neoplasm: Secondary | ICD-10-CM | POA: Diagnosis present

## 2022-08-05 DIAGNOSIS — Z8544 Personal history of malignant neoplasm of other female genital organs: Secondary | ICD-10-CM | POA: Diagnosis present

## 2022-08-05 DIAGNOSIS — Z9079 Acquired absence of other genital organ(s): Secondary | ICD-10-CM | POA: Diagnosis not present

## 2022-08-05 NOTE — Progress Notes (Signed)
Gynecologic Oncology Interval Visit   Referring Provider: Dr Ward-Dr. [name]  Chief Concern: vulvar cancer  Subjective:  Kimberly Bean is a 72 y.o. 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 Clarisa Fling followed by bilateral SLN sampling - negative on 02/20/20, currently on surveillance, who returns to clinic for follow up.   PAP 11/22 negative. She was last seen November 2023 with negative exam. Denies vulvar complaints.   Gynecologic Oncology History:  Seen by Dr Elesa Massed 12/22/19 in consultation from Dr Burnett Sheng.  She reports a vulvar lesion that has been ongoing since April this year. She has tried multiple things to help get rid of the lesion but nothing has worked. Intermittently has bleeding from the site.  Dr Elesa Massed reports a clitoral mass that is about 2cm x 2cm, ulcerated and white plaques present. Mass noted behind clitoral lesion extending internally and towards the right labia by approximately 2cm.  Dr Elesa Massed did periclitoral biopsy and results still pending,  Patient has had a hysterectomy. 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 Johnnette Litter. Final path: at least 1B scc of vulva, tumor size 2.9 cm, DOI 11 mm, negative margin, closest margin 6 mm. Inguinal lymph nodes not removed due to severe yeast infection.   On 02/20/20 she underwent bilateral inguinal SLN mapping and biopsies with three negative nodes on the right and one negative node on the left.   Case discussed at [facility] Case conference and surveillance was recommended. She completed course of lovenox.   Clinically NED.    Past Medical History: Past Medical History:  Diagnosis Date   Diabetes mellitus without complication (HCC)    Herpes    Last outbreak 1980   Hypertension     Past Surgical History: Past Surgical  History:  Procedure Laterality Date   ABDOMINAL HYSTERECTOMY     BREAST EXCISIONAL BIOPSY Right 2000s   neg   GALLBLADDER SURGERY     HERNIA REPAIR     Immunization History  Administered Date(s) Administered   Influenza-Unspecified 12/19/2020   PFIZER Comirnaty(Gray Top)Covid-19 Tri-Sucrose Vaccine 04/24/2019, 05/15/2019   PFIZER(Purple Top)SARS-COV-2 Vaccination 04/14/2019, 04/24/2019, 05/15/2019, 05/15/2019   Pneumococcal Conjugate-13 10/26/2017, 11/07/2017   Pneumococcal Polysaccharide-23 11/08/2018   Zoster Recombinat (Shingrix) 10/26/2017, 11/07/2017, 12/27/2017     Family History: Family History  Problem Relation Age of Onset   Breast cancer Neg Hx     Social History: She is divorced. Currently retired.  Social History   Socioeconomic History   Marital status: Married    Spouse name: Not on file   Number of children: Not on file   Years of education: Not on file   Highest education level: Not on file  Occupational History   Not on file  Tobacco Use   Smoking status: Never   Smokeless tobacco: Never  Substance and Sexual Activity   Alcohol use: Not Currently   Drug use: Never   Sexual activity: Not on file  Other Topics Concern   Not on file  Social History Narrative   Not on file   Social Determinants of Health   Financial Resource Strain: Not on file  Food Insecurity: Not on file  Transportation Needs: Not on file  Physical Activity: Not on file  Stress: Not on file  Social Connections: Not on file  Intimate Partner  Violence: Not on file   Immunization History  Administered Date(s) Administered   Influenza-Unspecified 12/19/2020   PFIZER Comirnaty(Gray Top)Covid-19 Tri-Sucrose Vaccine 04/24/2019, 05/15/2019   PFIZER(Purple Top)SARS-COV-2 Vaccination 04/14/2019, 04/24/2019, 05/15/2019, 05/15/2019   Pneumococcal Conjugate-13 10/26/2017, 11/07/2017   Pneumococcal Polysaccharide-23 11/08/2018   Zoster Recombinat (Shingrix) 10/26/2017, 11/07/2017,  12/27/2017    Allergies: Allergies  Allergen Reactions   Cephalexin Other (See Comments)   Penicillins Rash    Current Medications: Current Outpatient Medications  Medication Sig Dispense Refill   ADVAIR DISKUS 250-50 MCG/DOSE AEPB SMARTSIG:1 Puff(s) Via Inhaler Every 12 Hours     albuterol (VENTOLIN HFA) 108 (90 Base) MCG/ACT inhaler Inhale into the lungs.     ascorbic acid (VITAMIN C) 1000 MG tablet Take by mouth.     aspirin 325 MG tablet Take by mouth.     Calcium Carbonate-Vitamin D3 600-400 MG-UNIT TABS Take by mouth.     cyanocobalamin (VITAMIN B12) 1000 MCG tablet Take by mouth.     gemfibrozil (LOPID) 600 MG tablet Take 600 mg by mouth 2 (two) times daily.     metFORMIN (GLUCOPHAGE) 1000 MG tablet Take 1,000 mg by mouth 2 (two) times daily.     metoprolol tartrate (LOPRESSOR) 25 MG tablet Take 1 tablet by mouth 2 (two) times daily.     nateglinide (STARLIX) 120 MG tablet Take 120 mg by mouth 3 (three) times daily.     omega-3 fish oil (MAXEPA) 1000 MG CAPS capsule Take by mouth.     omeprazole (PRILOSEC) 20 MG capsule Take 20 mg by mouth daily.      pravastatin (PRAVACHOL) 40 MG tablet Take by mouth.     rOPINIRole (REQUIP) 0.25 MG tablet TAKE 1 TABLET BY MOUTH EVERY NIGHT AT NIGHT, MAY TITRATE BY 1 TABLET EVERY 2 TO 3 DAYS AS NEEDED.     alendronate (FOSAMAX) 70 MG tablet TAKE 1 TABLET BY MOUTH EVERY 7 DAYS WITH A FULL GLASS OF WATER. DO NOT LIE DOWN FOR THE NEXT 30 MINUTES (Patient not taking: Reported on 09/17/2021)     No current facility-administered medications for this visit.   Review of Systems General: no complaints  HEENT: no complaints  Lungs: no complaints  Cardiac: no complaints  GI: no complaints  GU: no complaints  Musculoskeletal: chronic back pain  Extremities: no complaints  Skin: no complaints  Neuro: no complaints  Endocrine: no complaints  Psych: no complaints        Objective:  Physical Examination:  Vitals:   08/05/22 1321  BP: (!)  142/59  Pulse: 69  Resp: 18  Temp: (!) 96.6 F (35.9 C)  SpO2: 93%    ECOG Performance Status: 1  GENERAL: Patient is a well appearing female in no acute distress HEENT:  PERRL, neck supple with midline trachea. Thyroid without masses.  NODES:  No cervical, supraclavicular, axillary, or inguinal lymphadenopathy palpated.  LUNGS:  Clear to auscultation bilaterally.   HEART:  Regular rate and rhythm.  ABDOMEN:  Soft, nontender. Protuberant abdomen, not distended. No masses or ascites.  EXTREMITIES:  No peripheral edema.   NEURO:  Nonfocal. Well oriented.  Appropriate affect.  Pelvic: Exam chaperoned by CMA. EGBUS: no lesions. Well healed vertical incision Cervix: surgically absent Vagina: no lesions, no discharge or bleeding Uterus: surgically absent BME: no palpable masses  Lab Review  none    Assessment:  Kimberly Bean is a 72 y.o. female diagnosed with stage IB vulvar cancer. Biopsy on 10/15 not diagnostic but suspicious for squamous  cell carcinoma. Underwent radical vulvectomy with Dr. Johnnette Litter on 01/15/20. Inguinal node dissection deferred due to inguinal candidal infection discovered at time of surgery. Final pathology with at least IB squamous cell carcinoma of the vulva. Tumor size 2.9 cm, depth of invasion 11 mm. Negative margin, closest margin 6 mm. Negative SLN mapping. Clinically asymptomatic and NED on exam.   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 Discussed surveillance and recommended close evaluations until Dec 2026. Then she can be released from Gynecologic Oncology Clinic. Would consider imaging based on clinical symptoms or exam findings.   She will continue to follow up with her pcp and ortho for chronic medical issues and  cancer screenings.   I personally had a face to face interaction and evaluated the patient jointly with the NP, Ms. Consuello Masse.  I have reviewed her history and available records and have performed the key portions of the physical exam including lymph node survey, abdominal exam, pelvic exam with my findings confirming those documented above by the APP.  I have discussed the case with the APP and the patient.  I agree with the above documentation, assessment and plan which was fully formulated by me.  Counseling was completed by me.   Elzabeth Mcquerry Leta Jungling, MD

## 2022-11-10 IMAGING — MG MM DIGITAL SCREENING BILAT W/ TOMO AND CAD
6 of 10 series · 6 of 30 positions shown · non-contrast
Comparison: Previous exam(s).

CLINICAL DATA: Screening.

EXAM:
DIGITAL SCREENING BILATERAL MAMMOGRAM WITH TOMOSYNTHESIS AND CAD
TECHNIQUE: Bilateral screening digital craniocaudal and mediolateral oblique
mammograms were obtained. Bilateral screening digital breast
tomosynthesis was performed. The images were evaluated with
computer-aided detection.

[L CC synth-2D]
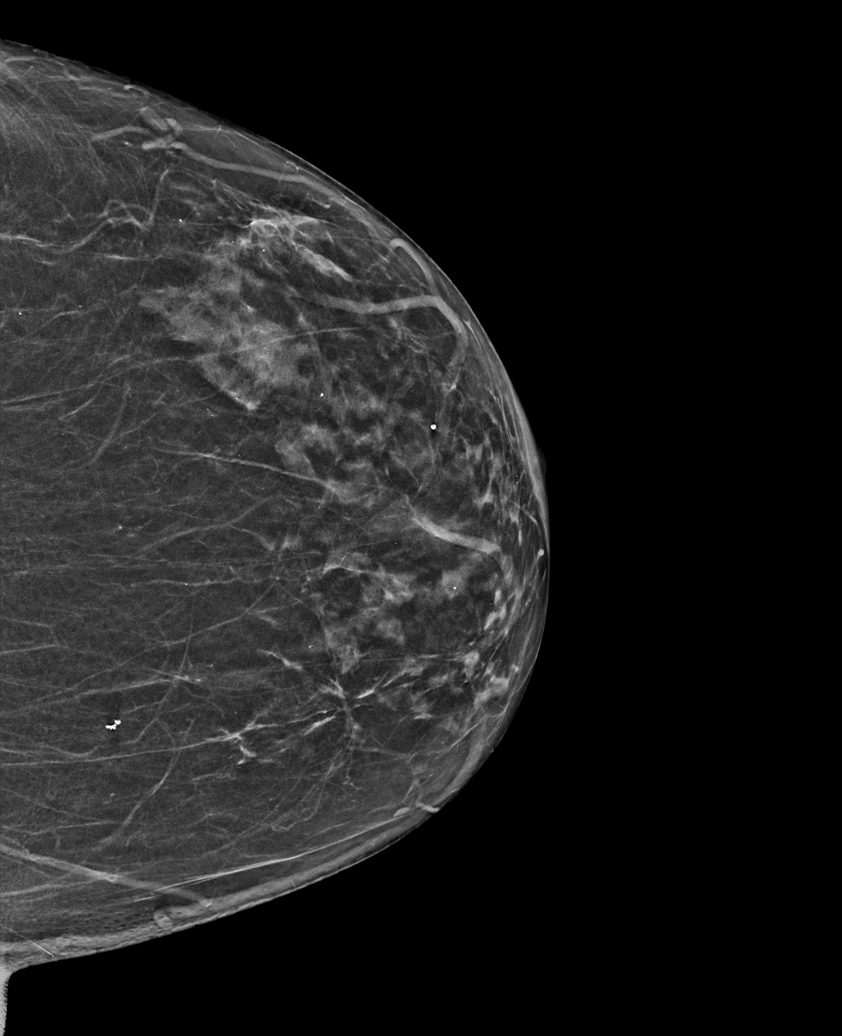

[R MLO synth-2D (1 of 2)]
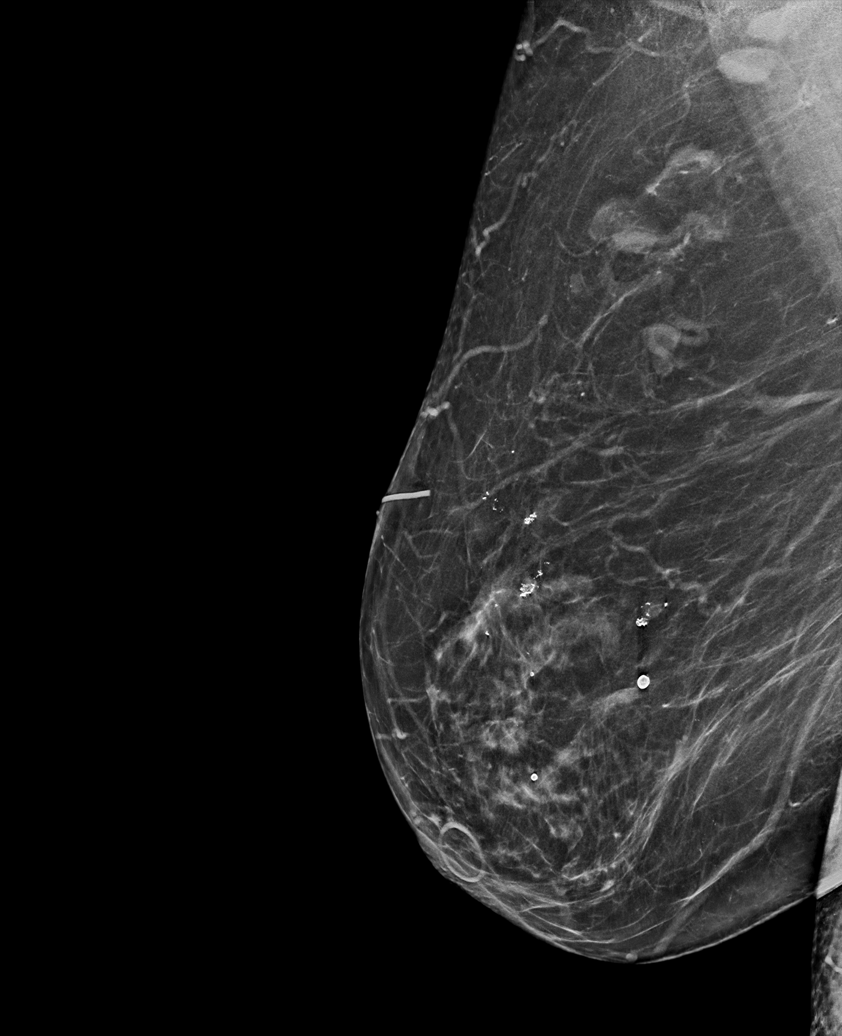

[R MLO synth-2D (2 of 2)]
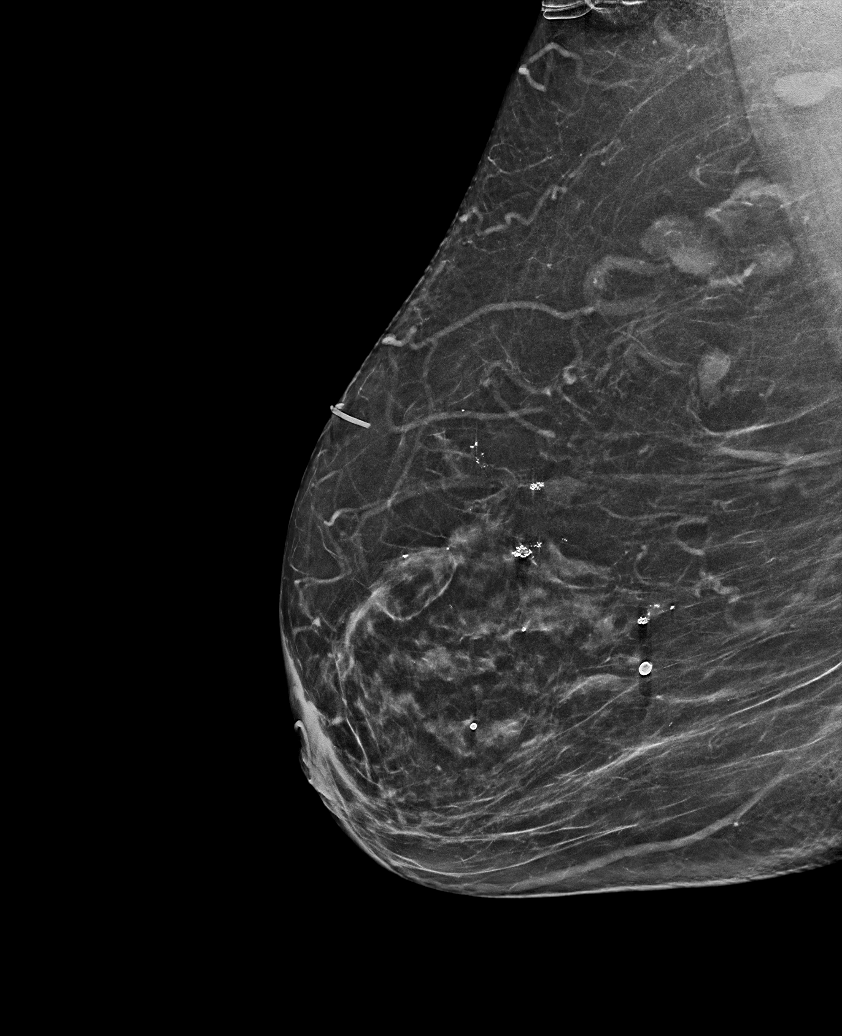

[L MLO synth-2D]
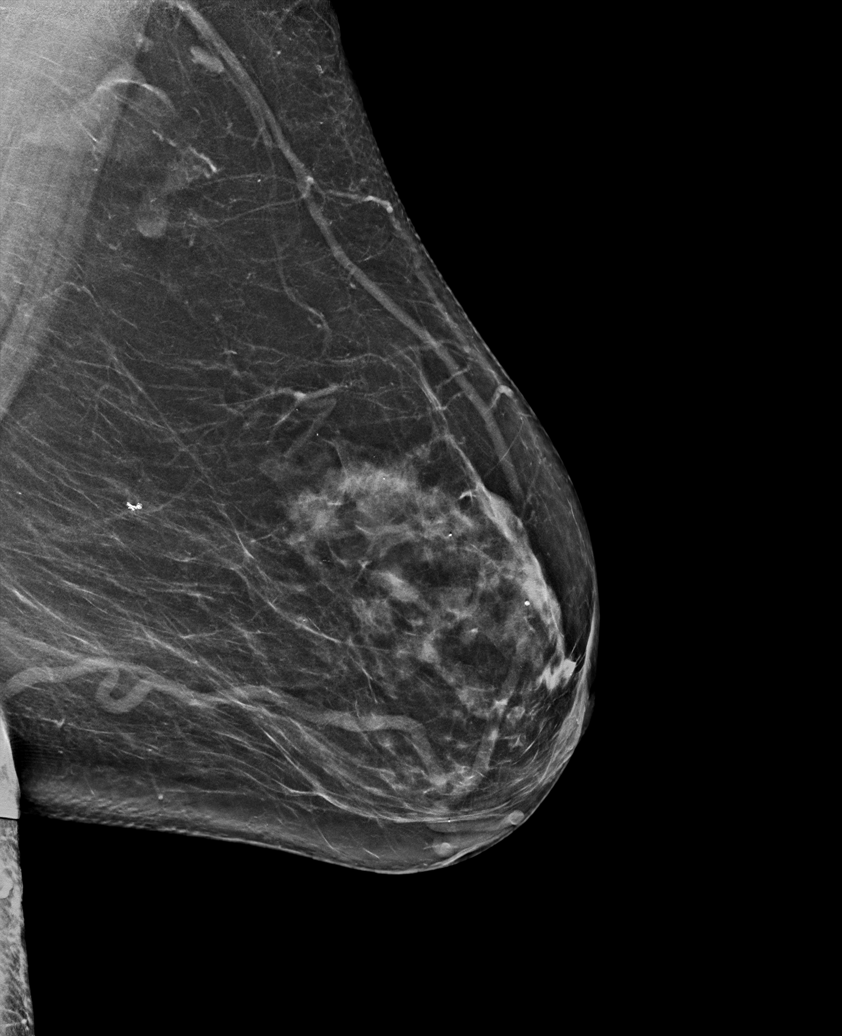

[R CC synth-2D]
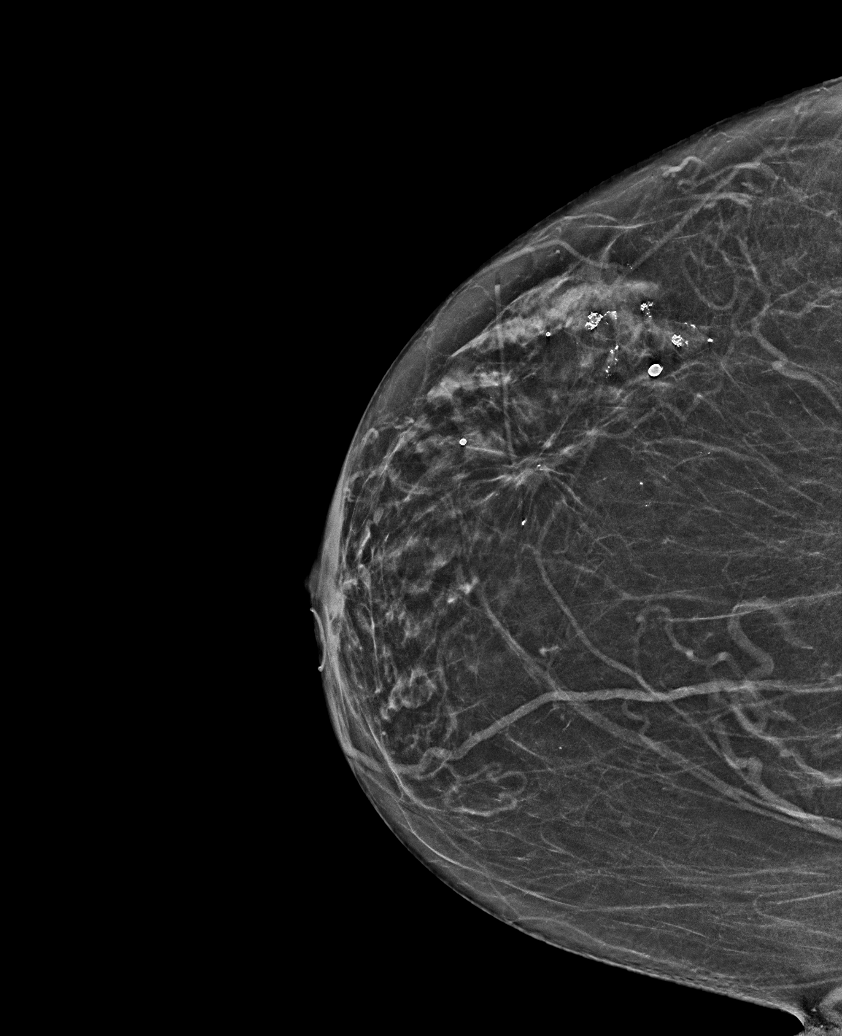

[R CC tomo · tomo slice 27/53.0]
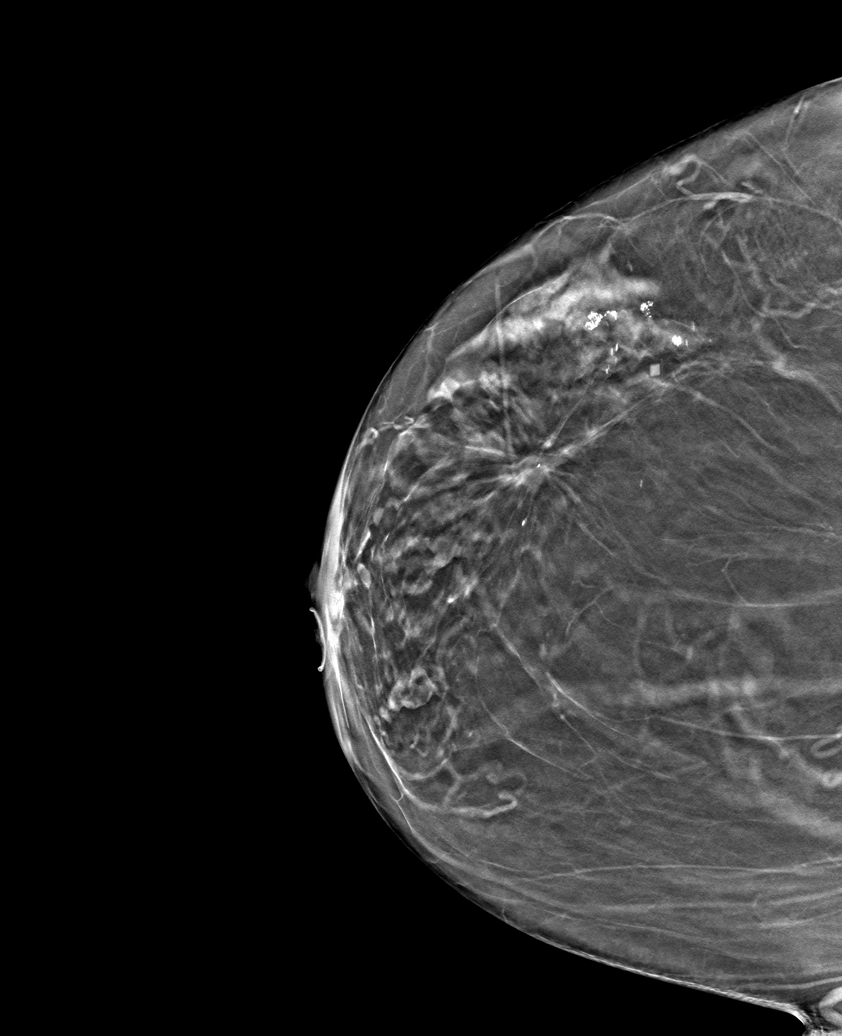

[6 of 30 positions shown; findings below may reference images not displayed]

ACR Breast Density Category b: There are scattered areas of
fibroglandular density.
FINDINGS: There are no findings suspicious for malignancy.
IMPRESSION: No mammographic evidence of malignancy. A result letter of this
screening mammogram will be mailed directly to the patient.

RECOMMENDATION:
Screening mammogram in one year. (Code:51-O-LD2)

BI-RADS CATEGORY  1: Negative.

## 2022-11-18 ENCOUNTER — Other Ambulatory Visit: Payer: Self-pay

## 2022-11-18 ENCOUNTER — Ambulatory Visit
Admission: RE | Admit: 2022-11-18 | Discharge: 2022-11-18 | Disposition: A | Discharge status: Home / Self Care | Payer: Medicare HMO | Source: Ambulatory Visit | Attending: Internal Medicine | Admitting: Internal Medicine

## 2022-11-18 ENCOUNTER — Ambulatory Visit: Payer: Medicare HMO | Admitting: Anesthesiology

## 2022-11-18 ENCOUNTER — Encounter
Admission: RE | Disposition: A | Discharge status: Home / Self Care | Payer: Self-pay | Source: Ambulatory Visit | Attending: Internal Medicine

## 2022-11-18 ENCOUNTER — Encounter: Payer: Self-pay | Admitting: Internal Medicine

## 2022-11-18 DIAGNOSIS — J449 Chronic obstructive pulmonary disease, unspecified: Secondary | ICD-10-CM | POA: Insufficient documentation

## 2022-11-18 DIAGNOSIS — Z6839 Body mass index (BMI) 39.0-39.9, adult: Secondary | ICD-10-CM | POA: Insufficient documentation

## 2022-11-18 DIAGNOSIS — I10 Essential (primary) hypertension: Secondary | ICD-10-CM | POA: Insufficient documentation

## 2022-11-18 DIAGNOSIS — Z1211 Encounter for screening for malignant neoplasm of colon: Secondary | ICD-10-CM | POA: Insufficient documentation

## 2022-11-18 DIAGNOSIS — E119 Type 2 diabetes mellitus without complications: Secondary | ICD-10-CM | POA: Insufficient documentation

## 2022-11-18 DIAGNOSIS — Z7984 Long term (current) use of oral hypoglycemic drugs: Secondary | ICD-10-CM | POA: Diagnosis not present

## 2022-11-18 DIAGNOSIS — K219 Gastro-esophageal reflux disease without esophagitis: Secondary | ICD-10-CM | POA: Diagnosis not present

## 2022-11-18 DIAGNOSIS — I2510 Atherosclerotic heart disease of native coronary artery without angina pectoris: Secondary | ICD-10-CM | POA: Insufficient documentation

## 2022-11-18 DIAGNOSIS — Z79899 Other long term (current) drug therapy: Secondary | ICD-10-CM | POA: Insufficient documentation

## 2022-11-18 DIAGNOSIS — K635 Polyp of colon: Secondary | ICD-10-CM | POA: Insufficient documentation

## 2022-11-18 DIAGNOSIS — K5730 Diverticulosis of large intestine without perforation or abscess without bleeding: Secondary | ICD-10-CM | POA: Insufficient documentation

## 2022-11-18 DIAGNOSIS — K640 First degree hemorrhoids: Secondary | ICD-10-CM | POA: Diagnosis not present

## 2022-11-18 DIAGNOSIS — I739 Peripheral vascular disease, unspecified: Secondary | ICD-10-CM | POA: Diagnosis not present

## 2022-11-18 HISTORY — DX: Sleep apnea, unspecified: G47.30

## 2022-11-18 HISTORY — DX: Chronic obstructive pulmonary disease, unspecified: J44.9

## 2022-11-18 HISTORY — PX: POLYPECTOMY: SHX5525

## 2022-11-18 HISTORY — PX: COLONOSCOPY: SHX5424

## 2022-11-18 LAB — GLUCOSE, CAPILLARY: Glucose-Capillary: 173 mg/dL — ABNORMAL HIGH (ref 70–99)

## 2022-11-18 SURGERY — COLONOSCOPY
Anesthesia: General

## 2022-11-18 MED ORDER — LIDOCAINE HCL (CARDIAC) PF 100 MG/5ML IV SOSY
PREFILLED_SYRINGE | INTRAVENOUS | Status: DC | PRN
  Administered 2022-11-18: 40 mg via INTRAVENOUS

## 2022-11-18 MED ORDER — MIDAZOLAM HCL 2 MG/2ML IJ SOLN
INTRAMUSCULAR | Status: AC
  Filled 2022-11-18: qty 2

## 2022-11-18 MED ORDER — PROPOFOL 1000 MG/100ML IV EMUL
INTRAVENOUS | Status: AC
Start: 2022-11-18 — End: ?
  Filled 2022-11-18: qty 200

## 2022-11-18 MED ORDER — PROPOFOL 500 MG/50ML IV EMUL
INTRAVENOUS | Status: DC | PRN
  Administered 2022-11-18: 100 ug/kg/min via INTRAVENOUS

## 2022-11-18 MED ORDER — PROPOFOL 10 MG/ML IV BOLUS
INTRAVENOUS | Status: DC | PRN
  Administered 2022-11-18: 30 mg via INTRAVENOUS

## 2022-11-18 MED ORDER — SEVOFLURANE IN SOLN
RESPIRATORY_TRACT | Status: AC
  Filled 2022-11-18: qty 250

## 2022-11-18 MED ORDER — SODIUM CHLORIDE 0.9 % IV SOLN: INTRAVENOUS | Status: DC

## 2022-11-18 NOTE — Transfer of Care (Signed)
Immediate Anesthesia Transfer of Care Note  Patient: Kimberly Bean  Procedure(s) Performed: COLONOSCOPY POLYPECTOMY  Patient Location: PACU  Anesthesia Type:General  Level of Consciousness: drowsy  Airway & Oxygen Therapy: Patient Spontanous Breathing  Post-op Assessment: Report given to RN, Post -op Vital signs reviewed and stable, Post -op Vital signs reviewed and unstable, Anesthesiologist notified, and Patient moving all extremities  Post vital signs: Reviewed and stable  Last Vitals:  Vitals Value Taken Time  BP 110/55 11/18/22 0847  Temp 35.4 C 11/18/22 0847  Pulse 63 11/18/22 0848  Resp 21 11/18/22 0848  SpO2 90 % 11/18/22 0848  Vitals shown include unfiled device data.  Last Pain:  Vitals:   11/18/22 0847  TempSrc: Temporal  PainSc:          Complications: No notable events documented.

## 2022-11-18 NOTE — Op Note (Signed)
[facility] Gastroenterology Patient Name: Kimberly Bean Procedure Date: 11/18/2022 8:23 AM MRN: [id number] Account #: [id number] Date of Birth: 04-27-1950 Admit Type: Outpatient Age: 72 Room: [facility] ENDO ROOM 2 Gender: Female Note Status: Finalized Instrument Name: Prentice Docker [id number] Procedure:             Colonoscopy Indications:           Screening for colorectal malignant neoplasm Providers:             Royce Macadamia K. Dorthie Santini MD, MD Medicines:             Propofol per Anesthesia Complications:         No immediate complications. Procedure:             Pre-Anesthesia Assessment:                        - The risks and benefits of the procedure and the                         sedation options and risks were discussed with the                         patient. All questions were answered and informed                         consent was obtained.                        - Patient identification and proposed procedure were                         verified prior to the procedure by the nurse. The                         procedure was verified in the procedure room.                        - ASA Grade Assessment: III - A patient with severe                         systemic disease.                        - After reviewing the risks and benefits, the patient                         was deemed in satisfactory condition to undergo the                         procedure.                        After obtaining informed consent, the colonoscope was                         passed under direct vision. Throughout the procedure,                         the patient's blood pressure, pulse, and oxygen  saturations were monitored continuously. The                         Colonoscope was introduced through the anus and                         advanced to the the cecum, identified by appendiceal                         orifice and ileocecal valve. The colonoscopy was                          performed without difficulty. The patient tolerated                         the procedure well. The quality of the bowel                         preparation was good. The ileocecal valve, appendiceal                         orifice, and rectum were photographed. Findings:      The perianal and digital rectal examinations were normal. Pertinent       negatives include normal sphincter tone and no palpable rectal lesions.      Internal hemorrhoids were found during retroflexion. The hemorrhoids       were Grade I (internal hemorrhoids that do not prolapse).      A single small-mouthed diverticulum was found in the sigmoid colon.      A 3 mm polyp was found in the sigmoid colon. The polyp was sessile. The       polyp was removed with a cold biopsy forceps. Resection and retrieval       were complete.      The exam was otherwise without abnormality. Impression:            - Internal hemorrhoids.                        - Diverticulosis in the sigmoid colon.                        - One 3 mm polyp in the sigmoid colon, removed with a                         cold biopsy forceps. Resected and retrieved.                        - The examination was otherwise normal. Recommendation:        - Patient has a contact number available for                         emergencies. The signs and symptoms of potential                         delayed complications were discussed with the patient.                         Return to normal activities tomorrow. Written  discharge instructions were provided to the patient.                        - Resume previous diet.                        - Continue present medications.                        - If polyps are benign or adenomatous without                         dysplasia, I will advise NO further colonoscopy due to                         advanced age and/or severe comorbidity.                        - Return to GI  office PRN.                        - The findings and recommendations were discussed with                         the patient. Procedure Code(s):     --- Professional ---                        [phone], Colonoscopy, flexible; with biopsy, single or                         multiple Diagnosis Code(s):     --- Professional ---                        K57.30, Diverticulosis of large intestine without                         perforation or abscess without bleeding                        D12.5, Benign neoplasm of sigmoid colon                        K64.0, First degree hemorrhoids                        Z12.11, Encounter for screening for malignant neoplasm                         of colon CPT copyright 2022 American Medical Association. All rights reserved. The codes documented in this report are preliminary and upon coder review may  be revised to meet current compliance requirements. Stanton Kidney MD, MD 11/18/2022 8:45:16 AM This report has been signed electronically. Number of Addenda: 0 Note Initiated On: 11/18/2022 8:23 AM Scope Withdrawal Time: 0 hours 5 minutes 32 seconds  Total Procedure Duration: 0 hours 10 minutes 55 seconds  Estimated Blood Loss:  Estimated blood loss: none.      [facility]

## 2022-11-18 NOTE — Anesthesia Preprocedure Evaluation (Addendum)
Anesthesia Evaluation  Patient identified by MRN, date of birth, ID band Patient awake    Reviewed: Allergy & Precautions, NPO status , Patient's Chart, lab work & pertinent test results  Airway Mallampati: III  TM Distance: >3 FB Neck ROM: full    Dental  (+) Edentulous Upper, Edentulous Lower   Pulmonary COPD   Pulmonary exam normal        Cardiovascular hypertension, On Home Beta Blockers and On Medications + CAD and + Peripheral Vascular Disease  Normal cardiovascular exam     Neuro/Psych  Headaches  negative psych ROS   GI/Hepatic Neg liver ROS,GERD  Medicated,,  Endo/Other  diabetes, Type 2, Oral Hypoglycemic Agents  Morbid obesity  Renal/GU negative Renal ROS  negative genitourinary   Musculoskeletal   Abdominal   Peds  Hematology negative hematology ROS (+)   Anesthesia Other Findings Past Medical History: No date: Diabetes mellitus without complication (HCC) No date: Herpes     Comment:  Last outbreak 1980 No date: Hypertension  Past Surgical History: No date: ABDOMINAL HYSTERECTOMY 2000s: BREAST EXCISIONAL BIOPSY; Right     Comment:  neg No date: GALLBLADDER SURGERY No date: HERNIA REPAIR  BMI    Body Mass Index: 39.39 kg/m      Reproductive/Obstetrics negative OB ROS                             Anesthesia Physical Anesthesia Plan  ASA: 3  Anesthesia Plan: General   Post-op Pain Management: Minimal or no pain anticipated   Induction: Intravenous  PONV Risk Score and Plan: 2 and Propofol infusion and TIVA  Airway Management Planned: Natural Airway and Nasal Cannula  Additional Equipment:   Intra-op Plan:   Post-operative Plan:   Informed Consent: I have reviewed the patients History and Physical, chart, labs and discussed the procedure including the risks, benefits and alternatives for the proposed anesthesia with the patient or authorized representative  who has indicated his/her understanding and acceptance.     Dental Advisory Given  Plan Discussed with: Anesthesiologist, CRNA and Surgeon  Anesthesia Plan Comments: (Patient consented for risks of anesthesia including but not limited to:  - adverse reactions to medications - risk of airway placement if required - damage to eyes, teeth, lips or other oral mucosa - nerve damage due to positioning  - sore throat or hoarseness - Damage to heart, brain, nerves, lungs, other parts of body or loss of life  Patient voiced understanding.)        Anesthesia Quick Evaluation

## 2022-11-18 NOTE — Anesthesia Postprocedure Evaluation (Signed)
Anesthesia Post Note  Patient: Kimberly Bean  Procedure(s) Performed: COLONOSCOPY POLYPECTOMY  Patient location during evaluation: Endoscopy Anesthesia Type: General Level of consciousness: awake and alert Pain management: pain level controlled Vital Signs Assessment: post-procedure vital signs reviewed and stable Respiratory status: spontaneous breathing, nonlabored ventilation, respiratory function stable and patient connected to nasal cannula oxygen Cardiovascular status: blood pressure returned to baseline and stable Postop Assessment: no apparent nausea or vomiting Anesthetic complications: no   No notable events documented.   Last Vitals:  Vitals:   11/18/22 0903 11/18/22 0908  BP: 109/62 119/61  Pulse:  (!) 58  Resp: (!) 24 (!) 23  Temp:    SpO2:      Last Pain:  Vitals:   11/18/22 0847  TempSrc: Temporal  PainSc:                  Louie Boston

## 2022-11-18 NOTE — H&P (Signed)
Outpatient short stay form Pre-procedure 11/18/2022 8:22 AM Kimberly Bean K. Norma Fredrickson, M.D.  Primary Physician: Jerl Mina, M.D.  Reason for visit:  Colon cancer screening  History of present illness:  Patient presents for colonoscopy for colon cancer screening. The patient denies complaints of abdominal pain, significant change in bowel habits, or rectal bleeding.      Current Facility-Administered Medications:    0.9 %  sodium chloride infusion, , Intravenous, Continuous, [place], Boykin Nearing, MD, Last Rate: 20 mL/hr at 11/18/22 0723, New Bag at 11/18/22 0723  Medications Prior to Admission  Medication Sig Dispense Refill Last Dose   ADVAIR DISKUS 250-50 MCG/DOSE AEPB SMARTSIG:1 Puff(s) Via Inhaler Every 12 Hours   11/18/2022 at 0500   ascorbic acid (VITAMIN C) 1000 MG tablet Take by mouth.   Past Week   Calcium Carbonate-Vitamin D3 600-400 MG-UNIT TABS Take by mouth.   Past Week   cyanocobalamin (VITAMIN B12) 1000 MCG tablet Take by mouth.   Past Week   metFORMIN (GLUCOPHAGE) 1000 MG tablet Take 1,000 mg by mouth 2 (two) times daily.   11/17/2022   metoprolol tartrate (LOPRESSOR) 25 MG tablet Take 1 tablet by mouth 2 (two) times daily.   11/18/2022 at 0600   nateglinide (STARLIX) 120 MG tablet Take 120 mg by mouth 3 (three) times daily.   Past Week   omega-3 fish oil (MAXEPA) 1000 MG CAPS capsule Take by mouth.   Past Week   omeprazole (PRILOSEC) 20 MG capsule Take 20 mg by mouth daily.    11/17/2022   pravastatin (PRAVACHOL) 40 MG tablet Take by mouth.   Past Week   rOPINIRole (REQUIP) 0.25 MG tablet TAKE 1 TABLET BY MOUTH EVERY NIGHT AT NIGHT, MAY TITRATE BY 1 TABLET EVERY 2 TO 3 DAYS AS NEEDED.   Past Week   albuterol (VENTOLIN HFA) 108 (90 Base) MCG/ACT inhaler Inhale into the lungs.      alendronate (FOSAMAX) 70 MG tablet TAKE 1 TABLET BY MOUTH EVERY 7 DAYS WITH A FULL GLASS OF WATER. DO NOT LIE DOWN FOR THE NEXT 30 MINUTES (Patient not taking: Reported on 09/17/2021)      aspirin 325 MG  tablet Take by mouth.   11/15/2022   gemfibrozil (LOPID) 600 MG tablet Take 600 mg by mouth 2 (two) times daily.   11/15/2022     Allergies  Allergen Reactions   Cephalexin Other (See Comments)   Penicillins Rash     Past Medical History:  Diagnosis Date   COPD (chronic obstructive pulmonary disease) (HCC)    Diabetes mellitus without complication (HCC)    Herpes    Last outbreak 1980   Hypertension    Sleep apnea     Review of systems:  Otherwise negative.    Physical Exam  Gen: Alert, oriented. Appears stated age.  HEENT: [place]/AT. PERRLA. Lungs: CTA, no wheezes. CV: RR nl S1, S2. Abd: soft, benign, no masses. BS+ Ext: No edema. Pulses 2+    Planned procedures: Proceed with colonoscopy. The patient understands the nature of the planned procedure, indications, risks, alternatives and potential complications including but not limited to bleeding, infection, perforation, damage to internal organs and possible oversedation/side effects from anesthesia. The patient agrees and gives consent to proceed.  Please refer to procedure notes for findings, recommendations and patient disposition/instructions.     Kimberly Bean K. Norma Fredrickson, M.D. Gastroenterology 11/18/2022  8:22 AM

## 2022-11-18 NOTE — Interval H&P Note (Signed)
History and Physical Interval Note:  11/18/2022 8:22 AM  Kimberly Bean  has presented today for surgery, with the diagnosis of Colon cancer screening (Z12.11).  The various methods of treatment have been discussed with the patient and family. After consideration of risks, benefits and other options for treatment, the patient has consented to  Procedure(s): COLONOSCOPY (N/A) as a surgical intervention.  The patient's history has been reviewed, patient examined, no change in status, stable for surgery.  I have reviewed the patient's chart and labs.  Questions were answered to the patient's satisfaction.     [place], [place]

## 2022-11-19 ENCOUNTER — Encounter: Payer: Self-pay | Admitting: Internal Medicine

## 2023-02-03 ENCOUNTER — Encounter: Payer: Self-pay | Admitting: Obstetrics and Gynecology

## 2023-02-03 ENCOUNTER — Inpatient Hospital Stay: Payer: Medicare HMO | Attending: Obstetrics and Gynecology | Admitting: Obstetrics and Gynecology

## 2023-02-03 VITALS — BP 146/76 | HR 76 | Temp 98.6°F | Resp 19 | Wt 193.2 lb

## 2023-02-03 DIAGNOSIS — Z9079 Acquired absence of other genital organ(s): Secondary | ICD-10-CM | POA: Insufficient documentation

## 2023-02-03 DIAGNOSIS — N9089 Other specified noninflammatory disorders of vulva and perineum: Secondary | ICD-10-CM | POA: Diagnosis not present

## 2023-02-03 DIAGNOSIS — Z8544 Personal history of malignant neoplasm of other female genital organs: Secondary | ICD-10-CM | POA: Insufficient documentation

## 2023-02-03 DIAGNOSIS — Z08 Encounter for follow-up examination after completed treatment for malignant neoplasm: Secondary | ICD-10-CM | POA: Diagnosis present

## 2023-02-03 DIAGNOSIS — C519 Malignant neoplasm of vulva, unspecified: Secondary | ICD-10-CM

## 2023-02-03 NOTE — Progress Notes (Signed)
Gynecologic Oncology Interval Visit   Referring Provider: Dr Kimberly Bean. Kimberly Bean  Chief Concern: vulvar cancer  Subjective:  Kimberly Bean is a 72 y.o. 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 Kimberly Bean followed by bilateral SLN sampling - negative on 02/20/20, currently on surveillance, who returns to clinic for follow up.   PAP 11/22 negative. She was last seen May 2024 with negative exam. Denies vulvar complaints.   Gynecologic Oncology History:  Seen by Kimberly Bean 12/22/19 in consultation from Kimberly Kimberly Bean.  She reports a vulvar lesion that has been ongoing since April this year. She has tried multiple things to help get rid of the lesion but nothing has worked. Intermittently has bleeding from the site.  Kimberly Bean reports a clitoral mass that is about 2cm x 2cm, ulcerated and white plaques present. Mass noted behind clitoral lesion extending internally and towards the right labia by approximately 2cm.  Kimberly Bean did periclitoral biopsy and results still pending,  Patient has had a hysterectomy. 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 Kimberly Bean. Final path: at least 1B scc of vulva, tumor size 2.9 cm, DOI 11 mm, negative margin, closest margin 6 mm. Inguinal lymph nodes not removed due to severe yeast infection.   On 02/20/20 she underwent bilateral inguinal SLN mapping and biopsies with three negative nodes on the right and one negative node on the left.   Case discussed at [facility] Case conference and surveillance was recommended. She completed course of lovenox.   Clinically NED.    Past Medical History: Past Medical History:  Diagnosis Date   COPD (chronic obstructive pulmonary disease) (HCC)    Diabetes mellitus without complication (HCC)    Herpes    Last outbreak 1980   Hypertension     Sleep apnea     Past Surgical History: Past Surgical History:  Procedure Laterality Date   ABDOMINAL HYSTERECTOMY     BREAST EXCISIONAL BIOPSY Right 2000s   neg   COLONOSCOPY N/A 11/18/2022   Procedure: COLONOSCOPY;  Surgeon: Toledo, Boykin Nearing, MD;  Location: [facility] ENDOSCOPY;  Service: Gastroenterology;  Laterality: N/A;   GALLBLADDER SURGERY     HERNIA REPAIR     POLYPECTOMY  11/18/2022   Procedure: POLYPECTOMY;  Surgeon: Stanton Kidney, MD;  [facility];  Service: Gastroenterology;;   Immunization History  Administered Date(s) Administered   Influenza-Unspecified 12/19/2020   PFIZER Comirnaty(Gray Top)Covid-19 Tri-Sucrose Vaccine 04/24/2019, 05/15/2019   PFIZER(Purple Top)SARS-COV-2 Vaccination 04/14/2019, 04/24/2019, 05/15/2019, 05/15/2019   Pneumococcal Conjugate-13 10/26/2017, 11/07/2017   Pneumococcal Polysaccharide-23 11/08/2018   Zoster Recombinant(Shingrix) 10/26/2017, 11/07/2017, 12/27/2017     Family History: Family History  Problem Relation Age of Onset   Breast cancer Neg Hx     Social History: She is divorced. Currently retired.  Social History   Socioeconomic History   Marital status: Divorced    Spouse name: Not on file   Number of children: Not on file   Years of education: Not on file   Highest education level: Not on file  Occupational History   Not on file  Tobacco Use   Smoking status: Never   Smokeless tobacco: Never  Vaping Use   Vaping status: Never Used  Substance and Sexual Activity   Alcohol use: Not Currently   Drug use: Never  Sexual activity: Not on file  Other Topics Concern   Not on file  Social History Narrative   Not on file   Social Determinants of Health   Financial Resource Strain: Patient Declined (12/22/2021)   Received from [facility], [facility]   Overall Financial Resource Strain (CARDIA)    Difficulty of Paying Living Expenses: Patient declined  Food  Insecurity: Patient Declined (12/22/2021)   Received from [facility], [facility]   Hunger Vital Sign    Worried About Running Out of Food in the Last Year: Patient declined    Ran Out of Food in the Last Year: Patient declined  Transportation Needs: Patient Declined (12/22/2021)   Received from [facility], [facility]   [facility] - Transportation    In the past 12 months, has lack of transportation kept you from medical appointments or from getting medications?: Patient declined    Lack of Transportation (Non-Medical): Patient declined  Physical Activity: Not on file  Stress: Not on file  Social Connections: Not on file  Intimate Partner Violence: Not on file   Immunization History  Administered Date(s) Administered   Influenza-Unspecified 12/19/2020   PFIZER Comirnaty(Gray Top)Covid-19 Tri-Sucrose Vaccine 04/24/2019, 05/15/2019   PFIZER(Purple Top)SARS-COV-2 Vaccination 04/14/2019, 04/24/2019, 05/15/2019, 05/15/2019   Pneumococcal Conjugate-13 10/26/2017, 11/07/2017   Pneumococcal Polysaccharide-23 11/08/2018   Zoster Recombinant(Shingrix) 10/26/2017, 11/07/2017, 12/27/2017    Allergies: Allergies  Allergen Reactions   Cephalexin Other (See Comments)   Penicillins Rash    Current Medications: Current Outpatient Medications  Medication Sig Dispense Refill   ADVAIR DISKUS 250-50 MCG/DOSE AEPB SMARTSIG:1 Puff(s) Via Inhaler Every 12 Hours     albuterol (VENTOLIN HFA) 108 (90 Base) MCG/ACT inhaler Inhale into the lungs.     alendronate (FOSAMAX) 70 MG tablet TAKE 1 TABLET BY MOUTH EVERY 7 DAYS WITH A FULL GLASS OF WATER. DO NOT LIE DOWN FOR THE NEXT 30 MINUTES (Patient not taking: Reported on 09/17/2021)     ascorbic acid (VITAMIN C) 1000 MG tablet Take by mouth.     aspirin 325 MG tablet Take by mouth.     Calcium Carbonate-Vitamin D3 600-400 MG-UNIT TABS Take by mouth.     cyanocobalamin (VITAMIN B12) 1000  MCG tablet Take by mouth.     gemfibrozil (LOPID) 600 MG tablet Take 600 mg by mouth 2 (two) times daily.     metFORMIN (GLUCOPHAGE) 1000 MG tablet Take 1,000 mg by mouth 2 (two) times daily.     metoprolol tartrate (LOPRESSOR) 25 MG tablet Take 1 tablet by mouth 2 (two) times daily.     nateglinide (STARLIX) 120 MG tablet Take 120 mg by mouth 3 (three) times daily.     omega-3 fish oil (MAXEPA) 1000 MG CAPS capsule Take by mouth.     omeprazole (PRILOSEC) 20 MG capsule Take 20 mg by mouth daily.      pravastatin (PRAVACHOL) 40 MG tablet Take by mouth.     rOPINIRole (REQUIP) 0.25 MG tablet TAKE 1 TABLET BY MOUTH EVERY NIGHT AT NIGHT, MAY TITRATE BY 1 TABLET EVERY 2 TO 3 DAYS AS NEEDED.     No current facility-administered medications for this visit.   Review of Systems General:  Generalized weakness Skin: no complaints Eyes: no complaints HEENT: no complaints Breasts: no complaints Pulmonary: no complaints Cardiac: no complaints Gastrointestinal: no complaints Genitourinary/Sexual: no complaints Ob/Gyn: no complaints Musculoskeletal: no complaints Hematology: no complaints Neurologic/Psych: no complaints  Objective:  Physical Examination:  There were no vitals filed for this visit.  ECOG Performance Status: 1  GENERAL: Patient is a well appearing female in no acute distress HEENT:  Sclera clear. Anicteric NODES:  Negative axillary, supraclavicular, inguinal lymph node survery LUNGS:  Clear to auscultation bilaterally.   HEART:  Regular rate and rhythm.  ABDOMEN:  Soft, nontender.  Rounded. Obese.  EXTREMITIES:  No peripheral edema. Atraumatic.  SKIN:  Clear with no obvious rashes or skin changes. Chronic thinning skin in inguinal folds with history of recurrent yeast NEURO:  Nonfocal. Well oriented.  Appropriate affect.  Pelvic: Exam chaperoned by Nursing EGBUS: no lesions Cervix: surgically absent Vagina: no lesions, no discharge or bleeding Uterus: surgically  absent BME: no palpable masses Rectal: deferred  Lab Review  none    Assessment:  UVA BORSETH is a 72 y.o. female diagnosed with stage IB vulvar cancer. Biopsy on 10/15 not diagnostic but suspicious for squamous cell carcinoma. Underwent radical vulvectomy with Kimberly. Kimberly Bean on 01/15/20. Inguinal node dissection deferred due to inguinal candidal infection discovered at time of surgery. Final pathology with at least IB squamous cell carcinoma of the vulva. Tumor size 2.9 cm, depth of invasion 11 mm. Negative margin, closest margin 6 mm. Negative SLN mapping. Returns for continued surveillance. Clinically asymptomatic and NED on exam.   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 Would consider imaging based on clinical symptoms or exam findings.   She will continue to follow up with her pcp and ortho for chronic medical issues and cancer screenings.   Leida Lauth, MD

## 2023-02-10 ENCOUNTER — Ambulatory Visit: Payer: Medicare HMO

## 2023-05-03 ENCOUNTER — Encounter: Payer: Self-pay | Admitting: Family Medicine

## 2023-05-03 ENCOUNTER — Other Ambulatory Visit: Payer: Self-pay | Admitting: Family Medicine

## 2023-05-03 DIAGNOSIS — Z1231 Encounter for screening mammogram for malignant neoplasm of breast: Secondary | ICD-10-CM

## 2023-06-07 ENCOUNTER — Ambulatory Visit
Admission: RE | Admit: 2023-06-07 | Discharge: 2023-06-07 | Disposition: A | Discharge status: Home / Self Care | Payer: Medicare HMO | Source: Ambulatory Visit | Attending: Family Medicine | Admitting: Family Medicine

## 2023-06-07 DIAGNOSIS — Z1231 Encounter for screening mammogram for malignant neoplasm of breast: Secondary | ICD-10-CM | POA: Insufficient documentation

## 2023-08-04 ENCOUNTER — Inpatient Hospital Stay: Payer: Medicare HMO | Attending: Obstetrics and Gynecology

## 2023-08-04 NOTE — Progress Notes (Deleted)
 Patient rescheduled.  Kimberly Iola Manila, MD

## 2023-08-11 ENCOUNTER — Encounter: Payer: Self-pay | Admitting: Obstetrics and Gynecology

## 2023-08-11 ENCOUNTER — Inpatient Hospital Stay: Attending: Obstetrics and Gynecology | Admitting: Obstetrics and Gynecology

## 2023-08-11 VITALS — BP 127/60 | HR 69 | Temp 98.6°F | Resp 17 | Wt 183.4 lb

## 2023-08-11 DIAGNOSIS — Z08 Encounter for follow-up examination after completed treatment for malignant neoplasm: Secondary | ICD-10-CM

## 2023-08-11 DIAGNOSIS — Z90722 Acquired absence of ovaries, bilateral: Secondary | ICD-10-CM | POA: Insufficient documentation

## 2023-08-11 DIAGNOSIS — Z8544 Personal history of malignant neoplasm of other female genital organs: Secondary | ICD-10-CM | POA: Insufficient documentation

## 2023-08-11 NOTE — Progress Notes (Signed)
 Gynecologic Oncology Interval Visit   Referring Provider: Dr Ward-Dr. Madelene Schanz  Chief Concern: Vulvar cancer  Subjective:  Kimberly Bean is a 73 y.o. 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 Marella Shams followed by bilateral SLN sampling - negative on 02/20/20, currently on surveillance, who returns to clinic for follow up.   PAP 11/22 negative. Denies vulvar complaints.   Gynecologic Oncology History:  Seen by Dr Author Board 12/22/19 in consultation from Dr Dean Every.  She reports a vulvar lesion that has been ongoing since April this year. She has tried multiple things to help get rid of the lesion but nothing has worked. Intermittently has bleeding from the site.  Dr Author Board reports a clitoral mass that is about 2cm x 2cm, ulcerated and white plaques present. Mass noted behind clitoral lesion extending internally and towards the right labia by approximately 2cm.  Dr Author Board did periclitoral biopsy and results still pending,  Patient has had a hysterectomy. 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 Marella Shams. Final path: at least 1B scc of vulva, tumor size 2.9 cm, DOI 11 mm, negative margin, closest margin 6 mm. Inguinal lymph nodes not removed due to severe yeast infection.   On 02/20/20 she underwent bilateral inguinal SLN mapping and biopsies with three negative nodes on the right and one negative node on the left.   Case discussed at [facility] Case conference and surveillance was recommended. She completed course of lovenox.   Clinically NED.    Past Medical History: Past Medical History:  Diagnosis Date   COPD (chronic obstructive pulmonary disease) (HCC)    Diabetes mellitus without complication (HCC)    Herpes    Last outbreak 1980   Hypertension    Sleep apnea    Past Surgical History: Past  Surgical History:  Procedure Laterality Date   ABDOMINAL HYSTERECTOMY     BREAST EXCISIONAL BIOPSY Right 2000s   neg   COLONOSCOPY N/A 11/18/2022   Procedure: COLONOSCOPY;  Surgeon: Toledo, Alphonsus Jeans, MD;  Location: [facility] ENDOSCOPY;  Service: Gastroenterology;  Laterality: N/A;   GALLBLADDER SURGERY     HERNIA REPAIR     POLYPECTOMY  11/18/2022   Procedure: POLYPECTOMY;  Surgeon: Cassie Click, MD;  Location: [facility] ENDOSCOPY;  Service: Gastroenterology;;   Immunization History  Administered Date(s) Administered   Influenza-Unspecified 12/19/2020   PFIZER Comirnaty(Gray Top)Covid-19 Tri-Sucrose Vaccine 04/24/2019, 05/15/2019   PFIZER(Purple Top)SARS-COV-2 Vaccination 04/14/2019, 04/24/2019, 05/15/2019, 05/15/2019   Pneumococcal Conjugate-13 10/26/2017, 11/07/2017   Pneumococcal Polysaccharide-23 11/08/2018   Zoster Recombinant(Shingrix) 10/26/2017, 11/07/2017, 12/27/2017   Family History: Family History  Problem Relation Age of Onset   Breast cancer Neg Hx    Social History: She is divorced. Currently retired.  Social History   Socioeconomic History   Marital status: Divorced    Spouse name: Not on file   Number of children: Not on file   Years of education: Not on file   Highest education level: Not on file  Occupational History   Not on file  Tobacco Use   Smoking status: Never   Smokeless tobacco: Never  Vaping Use   Vaping status: Never Used  Substance and Sexual Activity   Alcohol use: Not Currently   Drug use: Never   Sexual activity: Not on file  Other Topics Concern   Not on  file  Social History Narrative   Not on file   Social Drivers of Health   Financial Resource Strain: Patient Declined (06/28/2023)   Received from [facility]   Overall Financial Resource Strain (CARDIA)    Difficulty of Paying Living Expenses: Patient declined  Food Insecurity: Patient Declined (06/28/2023)   Received from [facility]   Hunger  Vital Sign    Worried About Running Out of Food in the Last Year: Patient declined    Ran Out of Food in the Last Year: Patient declined  Transportation Needs: Patient Declined (06/28/2023)   Received from [facility] - Transportation    In the past 12 months, has lack of transportation kept you from medical appointments or from getting medications?: Patient declined    Lack of Transportation (Non-Medical): Patient declined  Physical Activity: Not on file  Stress: Not on file  Social Connections: Not on file  Intimate Partner Violence: Not on file   Immunization History  Administered Date(s) Administered   Influenza-Unspecified 12/19/2020   PFIZER Comirnaty(Gray Top)Covid-19 Tri-Sucrose Vaccine 04/24/2019, 05/15/2019   PFIZER(Purple Top)SARS-COV-2 Vaccination 04/14/2019, 04/24/2019, 05/15/2019, 05/15/2019   Pneumococcal Conjugate-13 10/26/2017, 11/07/2017   Pneumococcal Polysaccharide-23 11/08/2018   Zoster Recombinant(Shingrix) 10/26/2017, 11/07/2017, 12/27/2017    Allergies: Allergies  Allergen Reactions   Cephalexin Other (See Comments)   Penicillins Rash    Current Medications: Current Outpatient Medications  Medication Sig Dispense Refill   ADVAIR DISKUS 250-50 MCG/DOSE AEPB SMARTSIG:1 Puff(s) Via Inhaler Every 12 Hours     albuterol (VENTOLIN HFA) 108 (90 Base) MCG/ACT inhaler Inhale into the lungs.     ascorbic acid (VITAMIN C) 1000 MG tablet Take by mouth.     aspirin 325 MG tablet Take by mouth.     Calcium Carbonate-Vitamin D3 600-400 MG-UNIT TABS Take by mouth.     cyanocobalamin (VITAMIN B12) 1000 MCG tablet Take by mouth.     gemfibrozil (LOPID) 600 MG tablet Take 600 mg by mouth 2 (two) times daily.     metFORMIN (GLUCOPHAGE) 1000 MG tablet Take 1,000 mg by mouth 2 (two) times daily.     metoprolol tartrate (LOPRESSOR) 25 MG tablet Take 1 tablet by mouth 2 (two) times daily.     nateglinide (STARLIX) 120 MG tablet Take 120 mg by mouth  3 (three) times daily.     omega-3 fish oil (MAXEPA) 1000 MG CAPS capsule Take by mouth.     omeprazole (PRILOSEC) 20 MG capsule Take 20 mg by mouth daily.      pravastatin (PRAVACHOL) 40 MG tablet Take by mouth.     alendronate (FOSAMAX) 70 MG tablet TAKE 1 TABLET BY MOUTH EVERY 7 DAYS WITH A FULL GLASS OF WATER. DO NOT LIE DOWN FOR THE NEXT 30 MINUTES (Patient not taking: Reported on 08/11/2023)     rOPINIRole (REQUIP) 0.25 MG tablet TAKE 1 TABLET BY MOUTH EVERY NIGHT AT NIGHT, MAY TITRATE BY 1 TABLET EVERY 2 TO 3 DAYS AS NEEDED. (Patient not taking: Reported on 08/11/2023)     No current facility-administered medications for this visit.   Review of Systems General:  no complaints Skin: no complaints Eyes: no complaints HEENT: no complaints Breasts: no complaints Pulmonary: shortness of breath with exertion Cardiac: no complaints Gastrointestinal: no complaints Genitourinary/Sexual: no complaints Ob/Gyn: no complaints Musculoskeletal: no complaints Hematology: no complaints Neurologic/Psych: no complaints   Objective:  Physical Examination:  Vitals:   08/11/23 0916  BP: 127/60  Pulse: 69  Resp: 17  Temp: 98.6 F (37 C)  SpO2: 96%    ECOG Performance Status: 1  GENERAL: Patient is a well appearing female in no acute distress HEENT:  Sclera clear. Anicteric NODES:  Negative axillary, supraclavicular, inguinal lymph node survey.  LUNGS:  Clear to auscultation bilaterally.   HEART:  Regular rate and rhythm.  ABDOMEN:  Soft, nontender. Rounded. Obese. Possible diastasis.  EXTREMITIES:  No peripheral edema. Atraumatic.  SKIN:  Clear with no obvious rashes or skin changes. Chronic thinning skin in inguinal folds with history of recurrent yeast NEURO:  Nonfocal. Well oriented.  Appropriate affect.  Pelvic: Exam chaperoned by NP EGBUS: no lesions. Well healed surgical scars.  Cervix: surgically absent Vagina: no lesions, no discharge or bleeding Uterus: surgically  absent BME: no palpable masses Rectal: deferred  Lab Review  none    Assessment:  BAYLIN CABAL is a 73 y.o. female diagnosed with stage IB vulvar cancer. Biopsy on 10/15 not diagnostic but suspicious for squamous cell carcinoma. Underwent radical vulvectomy with Dr. Marella Shams on 01/15/20. Inguinal node dissection deferred due to inguinal candidal infection discovered at time of surgery. Final pathology with at least IB squamous cell carcinoma of the vulva. Tumor size 2.9 cm, depth of invasion 11 mm. Negative margin, closest margin 6 mm. Negative SLN mapping. Returns for continued surveillance. Clinically asymptomatic and NED on exam.   Medical co-morbidities complicating care: HTN, diabetes and obesity, MI (2004 s/p stent), emphysema Plan:   Problem List Items Addressed This Visit       Other   Encounter for follow-up surveillance of vulvar cancer - Primary   We will plan to see her back in 6 months or sooner if needed. Would consider imaging based on clinical symptoms or exam findings.   She will continue to follow up with her pcp and ortho for chronic medical issues and cancer screenings.   Kenney Peacemaker, DNP, AGNP-C, AOCNP [facility] [phone] (clinic)  I personally interviewed and examined the patient. Agreed with the above/below plan of care. I have directly contributed to assessment and plan of care of this patient and educated and discussed with patient and family.  Hermine Loots, MD

## 2024-02-09 ENCOUNTER — Inpatient Hospital Stay: Attending: Obstetrics and Gynecology | Admitting: Obstetrics and Gynecology

## 2024-02-09 VITALS — BP 126/50 | HR 55 | Temp 96.0°F | Ht 59.0 in | Wt 174.7 lb

## 2024-02-09 DIAGNOSIS — Z9079 Acquired absence of other genital organ(s): Secondary | ICD-10-CM | POA: Diagnosis not present

## 2024-02-09 DIAGNOSIS — Z8544 Personal history of malignant neoplasm of other female genital organs: Secondary | ICD-10-CM | POA: Diagnosis present

## 2024-02-09 DIAGNOSIS — Z08 Encounter for follow-up examination after completed treatment for malignant neoplasm: Secondary | ICD-10-CM

## 2024-02-09 NOTE — Progress Notes (Signed)
 Gynecologic Oncology Interval Visit   Referring Provider: Dr Ward-Dr. Lovetta  Chief Concern: Vulvar cancer, surveillance  Subjective:  Kimberly Bean is a 73 y.o. 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 Mancil followed by bilateral SLN sampling - negative on 02/20/20, currently on surveillance, who returns to clinic for follow up.   PAP 11/22 negative. Denies vulvar complaints.   Gynecologic Oncology History:  Seen by Dr Neomi 12/22/19 in consultation from Dr Valora.  She reports a vulvar lesion that has been ongoing since April this year. She has tried multiple things to help get rid of the lesion but nothing has worked. Intermittently has bleeding from the site.  Dr Neomi reports a clitoral mass that is about 2cm x 2cm, ulcerated and white plaques present. Mass noted behind clitoral lesion extending internally and towards the right labia by approximately 2cm.  Dr Neomi did periclitoral biopsy and results still pending,  Patient has had a hysterectomy. 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 Mancil. Final path: at least 1B scc of vulva, tumor size 2.9 cm, DOI 11 mm, negative margin, closest margin 6 mm. Inguinal lymph nodes not removed due to severe yeast infection.   On 02/20/20 she underwent bilateral inguinal SLN mapping and biopsies with three negative nodes on the right and one negative node on the left.   Case discussed at [facility] Case conference and surveillance was recommended. She completed course of lovenox.   Clinically NED.    Past Medical History: Past Medical History:  Diagnosis Date   COPD (chronic obstructive pulmonary disease) (HCC)    Diabetes mellitus without complication (HCC)    Herpes    Last outbreak 1980   Hypertension    Sleep apnea    Past Surgical  History: Past Surgical History:  Procedure Laterality Date   ABDOMINAL HYSTERECTOMY     BREAST EXCISIONAL BIOPSY Right 2000s   neg   COLONOSCOPY N/A 11/18/2022   Procedure: COLONOSCOPY;  Surgeon: Toledo, Ladell POUR, MD;  Location: [facility] ENDOSCOPY;  Service: Gastroenterology;  Laterality: N/A;   GALLBLADDER SURGERY     HERNIA REPAIR     POLYPECTOMY  11/18/2022   Procedure: POLYPECTOMY;  Surgeon: Aundria Ladell POUR, MD;  Location: [facility] ENDOSCOPY;  Service: Gastroenterology;;   Immunization History  Administered Date(s) Administered   Influenza-Unspecified 12/19/2020   PFIZER Comirnaty(Gray Top)Covid-19 Tri-Sucrose Vaccine 04/24/2019, 05/15/2019   PFIZER(Purple Top)SARS-COV-2 Vaccination 04/14/2019, 04/24/2019, 05/15/2019, 05/15/2019   Pneumococcal Conjugate-13 10/26/2017, 11/07/2017   Pneumococcal Polysaccharide-23 11/08/2018   Zoster Recombinant(Shingrix) 10/26/2017, 11/07/2017, 12/27/2017   Family History: Family History  Problem Relation Age of Onset   Breast cancer Neg Hx    Social History: She is divorced. Currently retired.  Social History   Socioeconomic History   Marital status: Divorced    Spouse name: Not on file   Number of children: Not on file   Years of education: Not on file   Highest education level: Not on file  Occupational History   Not on file  Tobacco Use   Smoking status: Never   Smokeless tobacco: Never  Vaping Use   Vaping status: Never Used  Substance and Sexual Activity   Alcohol use: Not Currently   Drug use: Never   Sexual activity: Not on file  Other Topics Concern   Not  on file  Social History Narrative   Not on file   Social Drivers of Health   Financial Resource Strain: Patient Declined (06/28/2023)   Received from [facility]   Overall Financial Resource Strain (CARDIA)    Difficulty of Paying Living Expenses: Patient declined  Food Insecurity: Patient Declined (06/28/2023)   Received from [facility]   Hunger Vital Sign    Within the past 12 months, you worried that your food would run out before you got the money to buy more.: Patient declined    Within the past 12 months, the food you bought just didn't last and you didn't have money to get more.: Patient declined  Transportation Needs: Patient Declined (06/28/2023)   Received from [facility] - Transportation    In the past 12 months, has lack of transportation kept you from medical appointments or from getting medications?: Patient declined    Lack of Transportation (Non-Medical): Patient declined  Physical Activity: Not on file  Stress: Not on file  Social Connections: Not on file  Intimate Partner Violence: Not on file   Immunization History  Administered Date(s) Administered   Influenza-Unspecified 12/19/2020   PFIZER Comirnaty(Gray Top)Covid-19 Tri-Sucrose Vaccine 04/24/2019, 05/15/2019   PFIZER(Purple Top)SARS-COV-2 Vaccination 04/14/2019, 04/24/2019, 05/15/2019, 05/15/2019   Pneumococcal Conjugate-13 10/26/2017, 11/07/2017   Pneumococcal Polysaccharide-23 11/08/2018   Zoster Recombinant(Shingrix) 10/26/2017, 11/07/2017, 12/27/2017    Allergies: Allergies  Allergen Reactions   Cephalexin Other (See Comments)   Penicillins Rash    Current Medications: Current Outpatient Medications  Medication Sig Dispense Refill   ADVAIR DISKUS 250-50 MCG/DOSE AEPB SMARTSIG:1 Puff(s) Via Inhaler Every 12 Hours     albuterol (VENTOLIN HFA) 108 (90 Base) MCG/ACT inhaler Inhale into the lungs.     ascorbic acid (VITAMIN C) 1000 MG tablet Take by mouth.     aspirin 325 MG tablet Take by mouth.     Calcium Carbonate-Vitamin D3 600-400 MG-UNIT TABS Take by mouth.     cyanocobalamin (VITAMIN B12) 1000 MCG tablet Take by mouth.     gemfibrozil (LOPID) 600 MG tablet Take 600 mg by mouth 2 (two) times daily.     metFORMIN (GLUCOPHAGE) 1000 MG tablet Take 1,000 mg by mouth 2 (two) times daily.      metoprolol tartrate (LOPRESSOR) 25 MG tablet Take 1 tablet by mouth 2 (two) times daily.     nateglinide (STARLIX) 120 MG tablet Take 120 mg by mouth 3 (three) times daily.     omega-3 fish oil (MAXEPA) 1000 MG CAPS capsule Take by mouth.     omeprazole (PRILOSEC) 20 MG capsule Take 20 mg by mouth daily.      pravastatin (PRAVACHOL) 40 MG tablet Take by mouth.     No current facility-administered medications for this visit.   Review of Systems General:  no complaints Skin: no complaints Eyes: no complaints HEENT: no complaints Breasts: no complaints Pulmonary: shortness of breath Cardiac: no complaints Gastrointestinal: no complaints Genitourinary/Sexual: no complaints Ob/Gyn: no complaints Musculoskeletal: no complaints Hematology: no complaints Neurologic/Psych: no complaints    Objective:  Physical Examination:  Vitals:   02/09/24 0900  BP: (!) 126/50  Pulse: (!) 55  Temp: (!) 96 F (35.6 C)  SpO2: 90%   ECOG Performance Status: 1  GENERAL: Patient is a well appearing female in no acute distress HEENT:  Sclera clear. Anicteric NODES:  Negative axillary, supraclavicular, inguinal lymph node survery LUNGS:  Clear to auscultation bilaterally.  HEART:  Regular rate and rhythm.  ABDOMEN:  Soft, nontender.  Possible diastasis. No masses or ascites EXTREMITIES:  No peripheral edema. Atraumatic. No cyanosis SKIN:  Clear with no obvious rashes or skin changes.  NEURO:  Nonfocal. Well oriented.  Appropriate affect.  Pelvic: Exam chaperoned by NP EGBUS: no lesions. Well healed surgical scars.  Cervix: surgically absent Vagina: no lesions, no discharge or bleeding Uterus: surgically absent BME: no palpable masses Rectal: deferred  Lab Review None    Assessment:  Kimberly Bean is a 73 y.o. female diagnosed with stage IB vulvar cancer. Biopsy on 10/15 not diagnostic but suspicious for squamous cell carcinoma. Underwent radical vulvectomy with Dr. Mancil on  01/15/20. Inguinal node dissection deferred due to inguinal candidal infection discovered at time of surgery. Final pathology with at least IB squamous cell carcinoma of the vulva. Tumor size 2.9 cm, depth of invasion 11 mm. Negative margin, closest margin 6 mm. On 02/20/20 she underwent bilateral inguinal SLN mapping and biopsies with three negative nodes on the right and one negative node on the left. Returns for continued surveillance. Clinically asymptomatic and NED on exam.   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 Would consider imaging based on clinical symptoms or exam findings.   She will continue to follow up with her pcp and ortho for chronic medical issues and cancer screenings.   Tinnie Dawn, DNP, AGNP-C, AOCNP [facility] [phone] (clinic)  I personally had a face to face interaction and evaluated the patient jointly with the NP, Ms. Tinnie Dawn.  I have reviewed her history and available records and have performed the key portions of the physical exam including lymph node survey, abdominal exam, pelvic exam with my findings confirming those documented above by the APP.  I have discussed the case with the APP and the patient.  I agree with the above documentation, assessment and plan which was fully formulated by me.  Counseling was completed by me.   I personally saw the patient and performed a substantive portion of this encounter in conjunction with the listed APP as documented above.  Prentice Mancil, MD

## 2024-02-11 LAB — IGP, APTIMA HPV: HPV Aptima: NEGATIVE

## 2024-02-14 ENCOUNTER — Ambulatory Visit: Payer: Self-pay | Admitting: Nurse Practitioner

## 2024-02-14 NOTE — Telephone Encounter (Signed)
-----   Message from Tinnie KANDICE Dawn sent at 02/14/2024 11:09 AM EST ----- Please let patient know that her pap was negative which is great news. We'll follow up as planned.  Lauren ----- Message ----- From: Rebecka Memos Lab Results In Sent: 02/11/2024   4:36 PM EST To: Tinnie KANDICE Dawn, NP

## 2024-02-14 NOTE — Telephone Encounter (Signed)
 Attempted to reach patient to review pap results for patient. Left vm for patient- requesting a return phone call.

## 2024-02-15 NOTE — Telephone Encounter (Signed)
 patient returning phone call. Dob verified with patient. Results provided. Pt informed pap is negative and to keep follow-up as planned. Pt thanked me for providing results.

## 2024-08-09 ENCOUNTER — Inpatient Hospital Stay
